# Patient Record
Sex: Male | Born: 1950 | Race: White | Hispanic: No | Marital: Married | Smoking: Never smoker
Health system: Southern US, Community
[De-identification: ages and names within clinical notes are randomized; demographics above are authoritative.]

## PROBLEM LIST (undated history)

## (undated) DIAGNOSIS — E119 Type 2 diabetes mellitus without complications: Secondary | ICD-10-CM

## (undated) DIAGNOSIS — G4733 Obstructive sleep apnea (adult) (pediatric): Secondary | ICD-10-CM

## (undated) DIAGNOSIS — R7303 Prediabetes: Secondary | ICD-10-CM

## (undated) DIAGNOSIS — Z86711 Personal history of pulmonary embolism: Secondary | ICD-10-CM

## (undated) DIAGNOSIS — E785 Hyperlipidemia, unspecified: Secondary | ICD-10-CM

## (undated) DIAGNOSIS — I440 Atrioventricular block, first degree: Secondary | ICD-10-CM

## (undated) DIAGNOSIS — Z87442 Personal history of urinary calculi: Secondary | ICD-10-CM

## (undated) DIAGNOSIS — I82409 Acute embolism and thrombosis of unspecified deep veins of unspecified lower extremity: Secondary | ICD-10-CM

## (undated) DIAGNOSIS — Z8719 Personal history of other diseases of the digestive system: Secondary | ICD-10-CM

## (undated) DIAGNOSIS — Z85038 Personal history of other malignant neoplasm of large intestine: Secondary | ICD-10-CM

## (undated) DIAGNOSIS — E669 Obesity, unspecified: Secondary | ICD-10-CM

## (undated) DIAGNOSIS — K449 Diaphragmatic hernia without obstruction or gangrene: Secondary | ICD-10-CM

## (undated) DIAGNOSIS — Z7901 Long term (current) use of anticoagulants: Secondary | ICD-10-CM

## (undated) DIAGNOSIS — G4730 Sleep apnea, unspecified: Secondary | ICD-10-CM

## (undated) DIAGNOSIS — C189 Malignant neoplasm of colon, unspecified: Secondary | ICD-10-CM

## (undated) DIAGNOSIS — N201 Calculus of ureter: Secondary | ICD-10-CM

## (undated) DIAGNOSIS — Z86718 Personal history of other venous thrombosis and embolism: Secondary | ICD-10-CM

## (undated) DIAGNOSIS — I2699 Other pulmonary embolism without acute cor pulmonale: Secondary | ICD-10-CM

## (undated) HISTORY — PX: LAPAROSCOPIC ASSISTED VENTRAL HERNIA REPAIR: SHX6312

## (undated) HISTORY — PX: COLON SURGERY: SHX602

## (undated) HISTORY — PX: LITHOTRIPSY: SUR834

## (undated) HISTORY — PX: HERNIA REPAIR: SHX51

## (undated) HISTORY — PX: PARTIAL COLECTOMY: SHX5273

## (undated) HISTORY — PX: TOTAL KNEE ARTHROPLASTY: SHX125

## (undated) HISTORY — PX: REPLACEMENT TOTAL KNEE: SUR1224

---

## 2001-07-08 ENCOUNTER — Ambulatory Visit: Admission: RE | Admit: 2001-07-08 | Discharge: 2001-07-08 | Payer: Self-pay | Admitting: Internal Medicine

## 2001-12-25 ENCOUNTER — Ambulatory Visit (HOSPITAL_COMMUNITY): Admission: RE | Admit: 2001-12-25 | Discharge: 2001-12-25 | Payer: Self-pay | Admitting: Internal Medicine

## 2001-12-25 ENCOUNTER — Encounter: Payer: Self-pay | Admitting: Internal Medicine

## 2001-12-25 ENCOUNTER — Encounter: Admission: RE | Admit: 2001-12-25 | Discharge: 2001-12-25 | Payer: Self-pay | Admitting: Internal Medicine

## 2002-01-20 ENCOUNTER — Ambulatory Visit (HOSPITAL_COMMUNITY): Admission: RE | Admit: 2002-01-20 | Discharge: 2002-01-20 | Payer: Self-pay | Admitting: Gastroenterology

## 2002-01-20 ENCOUNTER — Encounter (INDEPENDENT_AMBULATORY_CARE_PROVIDER_SITE_OTHER): Payer: Self-pay | Admitting: Specialist

## 2002-02-10 ENCOUNTER — Encounter (INDEPENDENT_AMBULATORY_CARE_PROVIDER_SITE_OTHER): Payer: Self-pay | Admitting: Specialist

## 2002-02-10 ENCOUNTER — Inpatient Hospital Stay (HOSPITAL_COMMUNITY): Admission: RE | Admit: 2002-02-10 | Discharge: 2002-02-17 | Payer: Self-pay | Admitting: General Surgery

## 2003-02-22 ENCOUNTER — Ambulatory Visit (HOSPITAL_COMMUNITY): Admission: RE | Admit: 2003-02-22 | Discharge: 2003-02-22 | Payer: Self-pay | Admitting: Gastroenterology

## 2003-02-22 ENCOUNTER — Encounter (INDEPENDENT_AMBULATORY_CARE_PROVIDER_SITE_OTHER): Payer: Self-pay | Admitting: *Deleted

## 2003-06-08 ENCOUNTER — Emergency Department (HOSPITAL_COMMUNITY): Admission: AC | Admit: 2003-06-08 | Discharge: 2003-06-08 | Payer: Self-pay

## 2003-12-22 ENCOUNTER — Ambulatory Visit (HOSPITAL_BASED_OUTPATIENT_CLINIC_OR_DEPARTMENT_OTHER): Admission: RE | Admit: 2003-12-22 | Discharge: 2003-12-22 | Payer: Self-pay | Admitting: Internal Medicine

## 2004-12-20 ENCOUNTER — Encounter: Admission: RE | Admit: 2004-12-20 | Discharge: 2004-12-20 | Payer: Self-pay | Admitting: General Surgery

## 2005-02-21 ENCOUNTER — Observation Stay (HOSPITAL_COMMUNITY): Admission: RE | Admit: 2005-02-21 | Discharge: 2005-02-23 | Payer: Self-pay | Admitting: General Surgery

## 2009-08-09 ENCOUNTER — Inpatient Hospital Stay (HOSPITAL_COMMUNITY): Admission: RE | Admit: 2009-08-09 | Discharge: 2009-08-12 | Payer: Self-pay | Admitting: Orthopedic Surgery

## 2009-09-02 HISTORY — PX: TENDON REPAIR: SHX5111

## 2010-12-04 LAB — BASIC METABOLIC PANEL
BUN: 9 mg/dL (ref 6–23)
CO2: 29 mEq/L (ref 19–32)
Calcium: 8.6 mg/dL (ref 8.4–10.5)
Creatinine, Ser: 0.96 mg/dL (ref 0.4–1.5)
GFR calc Af Amer: >60 mL/min (ref >60)
Glucose, Bld: 165 mg/dL — ABNORMAL HIGH (ref 70–99)

## 2010-12-04 LAB — URINALYSIS, ROUTINE W REFLEX MICROSCOPIC
Hgb urine dipstick: NEGATIVE
Nitrite: NEGATIVE
Specific Gravity, Urine: 1.019 (ref 1.005–1.030)
Urobilinogen, UA: 0.2 mg/dL (ref 0.0–1.0)

## 2010-12-04 LAB — PROTIME-INR
INR: 0.95 (ref 0.00–1.49)
INR: 1.16 (ref 0.00–1.49)
INR: 1.72 — ABNORMAL HIGH (ref 0.00–1.49)
Prothrombin Time: 14.7 seconds (ref 11.6–15.2)
Prothrombin Time: 19.3 seconds — ABNORMAL HIGH (ref 11.6–15.2)

## 2010-12-04 LAB — CBC
HCT: 45.1 % (ref 39.0–52.0)
Hemoglobin: 15.4 g/dL (ref 13.0–17.0)
MCHC: 34.2 g/dL (ref 30.0–36.0)
MCHC: 34.6 g/dL (ref 30.0–36.0)
MCHC: 34.8 g/dL (ref 30.0–36.0)
MCV: 92.8 fL (ref 78.0–100.0)
MCV: 92.9 fL (ref 78.0–100.0)
MCV: 93 fL (ref 78.0–100.0)
Platelets: 156 10*3/uL (ref 150–400)
Platelets: 180 10*3/uL (ref 150–400)
Platelets: 230 10*3/uL (ref 150–400)
RBC: 4.86 MIL/uL (ref 4.22–5.81)
RDW: 13.2 % (ref 11.5–15.5)
RDW: 13.3 % (ref 11.5–15.5)
RDW: 13.4 % (ref 11.5–15.5)
RDW: 13.4 % (ref 11.5–15.5)
WBC: 10.2 10*3/uL (ref 4.0–10.5)
WBC: 7.7 10*3/uL (ref 4.0–10.5)

## 2010-12-04 LAB — DIFFERENTIAL
Eosinophils Absolute: 0.1 10*3/uL (ref 0.0–0.7)
Lymphs Abs: 1.8 10*3/uL (ref 0.7–4.0)
Monocytes Relative: 9 % (ref 3–12)
Neutrophils Relative %: 65 % (ref 43–77)

## 2010-12-04 LAB — COMPREHENSIVE METABOLIC PANEL
ALT: 25 U/L (ref 0–53)
AST: 20 U/L (ref 0–37)
Calcium: 10.5 mg/dL (ref 8.4–10.5)
GFR calc Af Amer: >60 mL/min (ref >60)
Glucose, Bld: 94 mg/dL (ref 70–99)
Sodium: 141 mEq/L (ref 135–145)
Total Protein: 7.3 g/dL (ref 6.0–8.3)

## 2010-12-04 LAB — TYPE AND SCREEN
ABO/RH(D): O POS
Antibody Screen: NEGATIVE

## 2011-01-04 ENCOUNTER — Other Ambulatory Visit: Payer: Self-pay | Admitting: Dermatology

## 2011-01-18 NOTE — Discharge Summary (Signed)
[facility]  Patient:    Connor King, Connor King Visit Number: [id number] MRN: [id number]          Service Type: SUR Location: 3W 0379 01 Attending Physician:  Brandy Hale Dictated by:   Angelia Mould. Derrell Lolling, M.D. Admit Date:  02/10/2002 Discharge Date: 02/17/2002   CC:         Thora Lance, M.D.  Genene Churn. Sherin Quarry, M.D.   Discharge Summary  FINAL DIAGNOSES: 1. Carcinoma of the sigmoid colon, stage T1 N0. 2. Acute Meckels diverticulitis. 3. Morbid obesity. 4. History of deep venous thrombosis, right leg.  OPERATION PERFORMED: 1. Sigmoid colectomy. 2. Small bowel resection with Meckels diverticulum. 3. Incidental appendectomy.  DATE OF SURGERY:  02/10/02.  HISTORY OF PRESENT ILLNESS:  The patient is a 60 year old man who developed deep venous thrombosis of his right leg in November 2002, and was placed on Coumadin.  Shortly after starting the Coumadin, he noted a little bit of bright red blood in his stool.  In early May 2003, he was treated for an "acute diverticulitis" with fever and right-sided abdominal pain.  He was given antibiotics.  A CT scan done at [facility] was normal.  Treatment was continued, and he improved.  Colonoscopy performed on Jan 20, 2002, showed several polyps in the sigmoid colon.  Most of these were small and were benign histologically, but there was a larger 3 cm polyp in the mid sigmoid which was ______ in several sections, but eventually removed in its entirety.  This revealed invasive adenocarcinoma arising in a large adenomatous polyp.  Two were invading into the stalk, and the tumor was present at the base of the polyp.  The patient was evaluated by me as an outpatient.  He was advised to undergo segmental resection of his sigmoid colon.  He had agreed with this. He underwent a bowel prep at home, and was brought to the hospital electively.  PHYSICAL EXAMINATION:  GENERAL:  A very pleasant gentleman,  morbidly obese with a stated weight of 350 pounds.  NECK:  No mass.  LUNGS:  Clear.  HEART:  Regular rate and rhythm, no murmur.  ABDOMEN:  Morbidly obese, soft, nontender, no mass, no herniated noted.  EXTREMITIES:  No edema, good pulses.  HOSPITAL COURSE:  On the day of admission, the patient was taken to the operating room.  He underwent exploratory laparotomy.  I felt no mass in the sigmoid colon, and a sigmoid colon resection was performed.  On exploration of the abdomen, I found acutely inflamed Meckels diverticulum in the right upper quadrant, but no abscess.  It was felt that this may have accounted for his pain and fever one month previously.  Segmental small bowel resection was performed.  Incidental appendectomy was performed.  The surgery was uneventful.  Final pathology report revealed that there was no residual cancer in the sigmoid colon, but the polypectomy site was identified.  Six benign lymph nodes were noted.  The appendix was benign.  Small bowel resection revealed Meckels diverticulum with inflammation and fibrosis, no malignancy seen.  Postoperatively, the patient did well.  He had low urine output the first 24 hours, but with fluid resuscitation his urine output picked up and he did well thereafter.  He had an ileus for several days.  Ultimately, the nasogastric tube was removed on 02/14/02.  He was maintained on heparin because of his chronic deep venous thrombosis, and we restarted his Coumadin.  However, shortly thereafter, after reviewing his  outpatient records, he had already had six months of Coumadin and this was discontinued.  He was discharged on 02/17/02.  At that time, he was doing well, tolerating a regular diet, had bowel movements, felt well and wanted to go home, he had no wound problems.  DISCHARGE MEDICATIONS: 1. Vicodin for pain. 2. Multivitamins with iron.  DISCHARGE LABORATORY DATA:  Hemoglobin was 9.2 on discharge.  FOLLOWUP:  He  was asked to follow up with me in the office in five to six days for staple removal. Dictated by:   Angelia Mould. Derrell Lolling, M.D. Attending Physician:  Brandy Hale DD:  03/18/02 TD:  03/22/02 Job: [id number]

## 2011-01-18 NOTE — Op Note (Signed)
[facility]  Patient:    Connor King, Connor King Visit Number: [id number] MRN: [id number]          Service Type: SUR Location: 3W 0379 01 Attending Physician:  Brandy Hale Dictated by:   Angelia Mould. Derrell Lolling, M.D. Proc. Date: 02/10/02 Admit Date:  02/10/2002 Discharge Date: 02/17/2002   CC:         Genene Churn. Sherin Quarry, M.D.  Thora Lance, M.D.   Operative Report  PREOPERATIVE DIAGNOSIS:  Carcinoma of the sigmoid colon.  POSTOPERATIVE DIAGNOSES: 1. Carcinoma of the sigmoid colon. 2. Meckels diverticulitis.  OPERATION: 1. Sigmoid colectomy with primary anastomosis. 2. Small bowel resection with inflamed Meckels diverticulum. 3. Incidental appendectomy.  SURGEON:  Angelia Mould. Derrell Lolling, M.D.  FIRST ASSISTANT:  Anselm Pancoast. Zachery Dakins, M.D.  OPERATIVE INDICATIONS:  This is a 60 year old, white man with a history of deep venous thrombosis of the right leg in November 2002.  While on Coumadin he started seeing some blood in his stool, although this was low volume. He felt a little bit fatigued.  He was treated for "diverticulitis" about six weeks ago.  He was placed on antibiotics because of fever and right lower quadrant pain.  He had a CT scan at [facility] which was reportedly normal. With the antibiotics he felt better.  He continued to have low volume rectal bleeding.  Colonoscopy was performed on Jan 20, 2002.  There were four polyps in the sigmoid colon.  Three of these were benign but there was a 3.0 cm polyp in the mid sigmoid colon which was removed "in its entirety" and on pathologic exam, it showed invasive adenocarcinoma arrising adenomatous polyp with the tumor into the submucosa and the tumor present at the base of the polyp. It was felt that he was at high risk for recurrent cancer and colon resection was recommended.  He was brought to the operating room electively after a bowel prep.  OPERATIVE TECHNIQUE:  Following the  induction of general endotracheal anesthesia, the patient was placed in the dorsal lithotomy position. I performED a colonoscopy and rigid proctoscopy.  I was able to pass both scopes up into the sigmoid colon and passed the colonoscope beyond that and I did not see any mucosal lesion.  I did not see any ulceration and therefore could not precisely localize the area where the adenomatous polyp had been.  The patient was then placed supine.  The abdomen was prepped and draped in a sterile fashion.  Midline laparotomy was performed and the abdomen was entered and explored.  I examined the appendix and that was normal.  I examined the right colon and it was normal.  I examined the sigmoid colon and descending colon and I felt no mass, saw no inflammatory process, really no evidence of diverticulitis. In the right upper quadrant, there was inflammatory mass involving the small bowel which had to be taken down and I found that he had what appeared to be an acutely and chronically inflamed Meckels diverticulum about 2-1/2 to 3 feet proximal to the ileocecal valve.  The liver and gallbladder felt normal. The omentum looked normal.  There was ascites.  I mobilized the sigmoid colon and descending colon by dividing their lateral peritoneal attachments. The mesentry was scored.  I transected the proximal rectum using a GIA stapling device.  This was transected about 8 or 10 cm above the peritoneal reflection.  The mesenteric vessels were then generously isolated, clamped, divided and ligated with 2-0 silk ties.  We transected the colon proximally at the junction of the descending colon and the sigmoid colon and in doing so removed about 12 inches of sigmoid colon.  The specimen was sent to the pathology department with the history of the cancerous polypectomy having been done.  We set up the anastomosis end-to-end with the distal descending colon anastomosis to the proximal rectum.  We cut off  the staple line in the proximal rectum.  Corner sutures of 2-0 silk were placed to set up the anastomosis.  The posterior wall of the anastomosis was created using interrupted sutures of 2-0 silk and all of these sutures were then tied.  The corners were turned in very carefully with inverting sutures.  The anterior wall of the anastomosis was completed with interrupted Lembert sutures of 2-0 silk and this provided a very secure anastomosis circumferentially.  There was no tension on the anastomosis.  There is very little mesentry to close it and so we chose not to do that.  We irrigated the pelvis out and found no active bleeding.  All of the packs were removed.  We then turned our attention to the appendix and decided to remove the appendix because of the history of right lower quadrant pain.  The appendix did look normal, however.  Appendiceal mesentry was isolated, clamped, divided and ligated with 2-0 silk ties.  A pursestring suture of 3-0 silk was placed in the cecum around the base of the appendix.  The appendix was clamped and amputated and removed.  The appendiceal stump was tied off with a 3-0 chromic tie.  The appendiceal stump was inverted and the pursestring suture tied. This provided a very secure closure of the cecal serosa.  We then turned our attention to the Meckels diverticulum.  We had to extend the midline incision up somewhat and very carefully had to dissect the inflamed Meckels away from the omentum.  Ultimately we were able to completely dissect this down out of the right upper quadrant.  We inspected the right colon, hepatic flexure and transverse colon and found that there was no injury or involvement of the colon.  We inspected the rest of the small bowel and found no other abnormality.  A couple of defects in the mesentry were actively bleeding and the bleeders were controlled.  Because of the  mesenteric defects and the acute inflammation we chose to resect  this area of small bowel.  We resected the small bowel about 1 inch proximal to the Meckels and about 12 inches distal to the Meckels to involve all of the areas of the damaged mesentry.  We isolated the mesenteric vessels, clamped, divided and ligated them with 2-0 silk ties.  We then placed the bowel walls side-to-side and created anastomosis with GIA stapling device on the antimesenteric wall.  We then held up the open bowel and closed it and transected it with a TA-60 stapling device.  This completed the anastomosis and completed the resection of the small bowel specimen with the Meckels which was sent to pathology.  The mesenteric bleeders were controlled with 2-0 silk sutures.  The mesentery in the small bowel was closed with interrupted sutures of 2-0 silk.  We then spent a great deal of time irrigating the abdomen and checking for bleeding.  We must have irrigated with 5 to 6 liters of saline.  There was no bleeding in any areas of dissection at the completion of the case.  Small bowel was carefully returned to its anatomic  position.  The colon anastomosis was again checked and looked fine. Omentum was returned to its anatomic position. Midline fascia was closed with a running suture of #1 PDS and the skin closed with skin staples.  Clean bandages were placed and the patient taken to the recovery room in stable condition.  Estimated blood loss was about 500 cc.  Complications were none.  Sponge, needle and instrument counts were correct. Dictated by:   Angelia Mould. Derrell Lolling, M.D. Attending Physician:  Brandy Hale DD:  02/10/02 TD:  02/12/02 Job: [id number]

## 2011-01-18 NOTE — Op Note (Signed)
NAMEVEER, ELAMIN               ACCOUNT NO.:  [id number]   MEDICAL RECORD NO.:  [id number]          PATIENT TYPE:  AMB   LOCATION:  DAY                          FACILITY:  [facility]   PHYSICIAN:  Angelia Mould. Derrell Lolling, M.D.DATE OF BIRTH:  [birth year]   DATE OF PROCEDURE:  02/21/2005  DATE OF DISCHARGE:                                 OPERATIVE REPORT   PREOPERATIVE DIAGNOSIS:  Incarcerated ventral hernia.   POSTOPERATIVE DIAGNOSES:  Incarcerated ventral hernia.   OPERATION PERFORMED:  1.  Laparoscopic lysis of adhesions (extensive).  2.  Laparoscopic ventral hernia repair with 18 cm x 22 cm Parietex composite      mesh.   SURGEON:  Angelia Mould. Derrell Lolling, M.D.   FIRST ASSISTANT:  Anselm Pancoast. Zachery Dakins, M.D.   OPERATIVE INDICATIONS:  This is a 60 year old white man with morbid obesity  who underwent a sigmoid colon resection with primary anastomosis, small  bowel resection for Meckel's diverticulitis and incidental appendectomy on  February 10, 2002. He had a small carcinoma of the sigmoid colon stage T1N0.  There has been no evidence of recurrence. He has noticed a bulge just to the  right of his umbilicus for several months. He has been gaining weight and is  up to 345 pounds. On exam, he has a moderately large ventral hernia  protruding to the right at the umbilicus. The defect in the fascia is  probably at least 6 cm in size, I can partially reduce this. CT scan shows a  lot of omentum in this and one loop of small bowel close by. Elective  surgery was advised because of the risk of incarceration and strangulation.  He is brought to the operating room electively.   OPERATIVE TECHNIQUE:  Following the induction of general endotracheal  anesthesia, intravenous antibiotics were given. The patient was identified,  the abdomen was prepped and draped in a sterile fashion. A 12 mm Optiview  port was placed in the left upper quadrant, this was atraumatic, inserted  without difficulty and  pneumoperitoneum was created. A video camera was  inserted. We found that he had extensive adhesions from the falciform  ligament all the way down into the pelvis although things were free on the  left inside where the trocar was. Most of these adhesions were omental, but  there were some small bowel adhesions that were fairly complicated. I placed  a 5 mm trocar in the left lower quadrant and a 5 mm trocar in the upper  abdomen to the left of the falciform ligament. Using a variety of dissection  techniques, blunt dissection, sharp scissor dissection, harmonic scalpel, we  slowly took down adhesions. This was a very tedious job because there was  lots of adhesions and there was a  primary defect about 6 or 7 cm in size  just to the right and above the umbilicus and a couple of smaller defects,  just below that. We had two or three loops of small bowel that were very  close to the hernias and I had to the very slowly dissect these with scissor  dissection but was  able to do this. I was able to reduce the fatty tissue  and omentum from the hernia sac and dissect all of that out of the hernia  sac and leave the hernia sac back in the defect. After about 90 minutes, we  had all of the dissection done and the abdominal wall completely cleaned off  circumferentially.   We marked the hernia defects with a spinal needle and then marked a circle  around the defect 4 cm out to the side. We measured this and it was about 16  x 20 cm. We brought a large piece of Parietex dual layer composite mesh to  the operative field and drew a template on the mesh to match the template on  the abdominal wall. We marked eight equidistant points for eight fixation  sutures. This wound up being about 22 cm vertically by about 18 cm  transversely. We placed eight interrupted sutures of #0 Nurolon in the mesh  all the way around being careful to place the sutures and the knots on the  rough surface that would go up  against the abdominal wall and to be sure  that the smooth atraumatic surface would be against the abdominal viscera.  After we had placed all eight sutures, we moistened the mesh, rolled it up  and inserted it into the abdominal cavity. We positioned the mesh and  oriented it according to the markings. We then made small incisions at the  suture fixation points. We then passed all of the sutures through the  abdominal wall at the eight suture fixation points being careful to get at  least a 1 cm bite of the IT fascia. After all of the sutures were past the  abdominal wall, the mesh was then lifted up and the positioning was actually  quite good with very little redundancy in the mesh. We tied all eight  sutures and then used a 5 mm tacker to tack the perimeter of the mesh to the  abdominal wall. Great care was taken to be able to palpate the tacker  through the abdominal wall so as to sink the tacks into the abdominal wall  fascia and muscle. We we used almost 60 tacks to do this so that they would  be fairly close to each other around the perimeter to avoid any gaps. We  placed a few tacks centrally to prevent the mesh from billowing. After all  of this was done, we checked all of the areas of dissection. We then checked  the small bowel and omentum. There had been a little bit of bleeding but  this was all stopped and there is no active bleeding whatsoever. The small  bowel all looked perfectly healthy, there was no sign of any injury  whatsoever not even a serosal tear that we could identify. The  pneumoperitoneum was released and the trocars were removed. The incisions  were closed with skin staples. Clean bandages were placed and the patient  taken to the recovery room in stable condition. Estimated blood loss was  about 30-40 mL. Complications none. Sponge, needle and instrument counts  were correct.      HMI/MEDQ  D:  02/21/2005  T:  02/21/2005  Job:  [id number]   cc:   Thora Lance, M.D.  [address] [place]  [place] [address]  Fax: [phone]   Tasia Catchings, M.D.  [address]  [address]  [place]  [place] [address]  Fax: [phone]

## 2011-04-16 ENCOUNTER — Other Ambulatory Visit: Payer: Self-pay | Admitting: Gastroenterology

## 2011-04-16 ENCOUNTER — Ambulatory Visit (HOSPITAL_COMMUNITY)
Admission: RE | Admit: 2011-04-16 | Discharge: 2011-04-16 | Disposition: A | Discharge status: Home / Self Care | Payer: Managed Care, Other (non HMO) | Source: Ambulatory Visit | Attending: Gastroenterology | Admitting: Gastroenterology

## 2011-04-16 DIAGNOSIS — K5730 Diverticulosis of large intestine without perforation or abscess without bleeding: Secondary | ICD-10-CM | POA: Insufficient documentation

## 2011-04-16 DIAGNOSIS — Z85038 Personal history of other malignant neoplasm of large intestine: Secondary | ICD-10-CM | POA: Insufficient documentation

## 2011-04-16 DIAGNOSIS — K620 Anal polyp: Secondary | ICD-10-CM | POA: Insufficient documentation

## 2011-04-16 DIAGNOSIS — K621 Rectal polyp: Secondary | ICD-10-CM | POA: Insufficient documentation

## 2011-04-16 DIAGNOSIS — G4733 Obstructive sleep apnea (adult) (pediatric): Secondary | ICD-10-CM | POA: Insufficient documentation

## 2011-04-16 DIAGNOSIS — D126 Benign neoplasm of colon, unspecified: Secondary | ICD-10-CM | POA: Insufficient documentation

## 2012-07-24 ENCOUNTER — Inpatient Hospital Stay (HOSPITAL_COMMUNITY)
Admission: EM | Admit: 2012-07-24 | Discharge: 2012-07-27 | DRG: 494 | Disposition: A | Discharge status: Home / Self Care | Payer: BC Managed Care – PPO | Attending: General Surgery | Admitting: General Surgery

## 2012-07-24 ENCOUNTER — Encounter (HOSPITAL_COMMUNITY): Payer: Self-pay | Admitting: Emergency Medicine

## 2012-07-24 ENCOUNTER — Emergency Department (HOSPITAL_COMMUNITY): Payer: BC Managed Care – PPO

## 2012-07-24 ENCOUNTER — Ambulatory Visit
Admission: RE | Admit: 2012-07-24 | Discharge: 2012-07-24 | Disposition: A | Discharge status: Home / Self Care | Payer: BC Managed Care – PPO | Source: Ambulatory Visit | Attending: Internal Medicine | Admitting: Internal Medicine

## 2012-07-24 ENCOUNTER — Other Ambulatory Visit: Payer: Self-pay | Admitting: Internal Medicine

## 2012-07-24 DIAGNOSIS — Z96659 Presence of unspecified artificial knee joint: Secondary | ICD-10-CM

## 2012-07-24 DIAGNOSIS — K819 Cholecystitis, unspecified: Secondary | ICD-10-CM

## 2012-07-24 DIAGNOSIS — K8010 Calculus of gallbladder with chronic cholecystitis without obstruction: Secondary | ICD-10-CM

## 2012-07-24 DIAGNOSIS — K8000 Calculus of gallbladder with acute cholecystitis without obstruction: Principal | ICD-10-CM | POA: Diagnosis present

## 2012-07-24 DIAGNOSIS — R1011 Right upper quadrant pain: Secondary | ICD-10-CM

## 2012-07-24 DIAGNOSIS — Z85038 Personal history of other malignant neoplasm of large intestine: Secondary | ICD-10-CM

## 2012-07-24 DIAGNOSIS — Z86718 Personal history of other venous thrombosis and embolism: Secondary | ICD-10-CM

## 2012-07-24 DIAGNOSIS — Z6841 Body Mass Index (BMI) 40.0 and over, adult: Secondary | ICD-10-CM

## 2012-07-24 DIAGNOSIS — G4730 Sleep apnea, unspecified: Secondary | ICD-10-CM | POA: Diagnosis present

## 2012-07-24 HISTORY — DX: Acute embolism and thrombosis of unspecified deep veins of unspecified lower extremity: I82.409

## 2012-07-24 HISTORY — DX: Sleep apnea, unspecified: G47.30

## 2012-07-24 HISTORY — DX: Malignant neoplasm of colon, unspecified: C18.9

## 2012-07-24 HISTORY — DX: Diaphragmatic hernia without obstruction or gangrene: K44.9

## 2012-07-24 HISTORY — DX: Obesity, unspecified: E66.9

## 2012-07-24 LAB — COMPREHENSIVE METABOLIC PANEL
ALT: 16 U/L (ref 0–53)
Calcium: 10.3 mg/dL (ref 8.4–10.5)
Creatinine, Ser: 1.01 mg/dL (ref 0.50–1.35)
GFR calc Af Amer: >90 mL/min (ref >90)
GFR calc non Af Amer: 78 mL/min — ABNORMAL LOW (ref >90)
Glucose, Bld: 128 mg/dL — ABNORMAL HIGH (ref 70–99)
Sodium: 139 mEq/L (ref 135–145)
Total Protein: 7.7 g/dL (ref 6.0–8.3)

## 2012-07-24 LAB — CBC WITH DIFFERENTIAL/PLATELET
Basophils Relative: 0 % (ref 0–1)
Hemoglobin: 15.8 g/dL (ref 13.0–17.0)
Lymphs Abs: 1.3 10*3/uL (ref 0.7–4.0)
MCHC: 34.7 g/dL (ref 30.0–36.0)
Monocytes Relative: 7 % (ref 3–12)
Neutro Abs: 10.4 10*3/uL — ABNORMAL HIGH (ref 1.7–7.7)
Neutrophils Relative %: 82 % — ABNORMAL HIGH (ref 43–77)
RBC: 5.04 MIL/uL (ref 4.22–5.81)

## 2012-07-24 LAB — TROPONIN I: Troponin I: <0.3 ng/mL (ref <0.30)

## 2012-07-24 MED ORDER — SODIUM CHLORIDE 0.9 % IV SOLN
3.0000 g | Freq: Once | INTRAVENOUS | Status: AC
  Administered 2012-07-24: 3 g via INTRAVENOUS
  Filled 2012-07-24: qty 3

## 2012-07-24 MED ORDER — IOHEXOL 350 MG/ML SOLN
100.0000 mL | Freq: Once | INTRAVENOUS | Status: AC | PRN
  Administered 2012-07-24: 100 mL via INTRAVENOUS

## 2012-07-24 NOTE — H&P (Signed)
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   Chief Complaint: right upper quadrant abdominal pain HPI: patient is a 61 year old male who just about 24 hours ago developed the rapid onset of moderately severe sharp and pressure-like right upper quadrant pain and pain beneath his right rib cage on the right chest. This was associated with some nausea but no vomiting. The pain persisted through the night last night and he saw his PCP this morning. Blood work was drawn and he was sent for an outpatient ultrasound of the gallbladder. As noted below this shows multiple large gallstones without apparent evidence of cholecystitis. He was found to have a minimally elevated d-dimer and with a history of previous DVT he was [facility] for a CT angiogram the chest to rule out PE. As noted below this does not show a PE but does show evidence of cholecystitis with pericholecystic fluid. He has continued to have ongoing pain beneath his right rib cage and right upper quadrant abdomen although this is not markedly severe. He is not currently having any nausea or vomiting. No fever or chills. He has no history of any similar problems. No problems with his bowel movements. No urinary symptoms. No evidence of jaundice.  Past Medical History  Diagnosis Date  . DVT (deep venous thrombosis)   . Sleep apnea   . Obese   . Hiatal hernia   . Colon cancer     Past Surgical History  Procedure Date  . Replacement total knee     left knee  . Hernia repair   . Colon surgery     History reviewed. No pertinent family history. Social History:  reports that he has never smoked. He does not have any smokeless tobacco history on file. He reports that he does not drink alcohol or use illicit drugs.  Allergies: No Known Allergies  Current Facility-Administered Medications  Medication Dose Route Frequency Provider Last Rate Last Dose  . [COMPLETED] Ampicillin-Sulbactam (UNASYN) 3 g in sodium chloride 0.9 %  100 mL IVPB  3 g Intravenous Once Ward Givens, MD 100 mL/hr at 07/24/12 2117 3 g at 07/24/12 2117  . [COMPLETED] iohexol (OMNIPAQUE) 350 MG/ML injection 100 mL  100 mL Intravenous Once PRN Medication Radiologist, MD   100 mL at 07/24/12 2005   No current outpatient prescriptions on file.     Results for orders placed during the hospital encounter of 07/24/12 (from the past 48 hour(s))  TROPONIN I     Status: Normal   Collection Time   07/24/12  6:26 PM      Component Value Range Comment   Troponin I <0.30  <0.30 ng/mL   COMPREHENSIVE METABOLIC PANEL     Status: Abnormal   Collection Time   07/24/12  6:40 PM      Component Value Range Comment   Sodium 139  135 - 145 mEq/L    Potassium 3.6  3.5 - 5.1 mEq/L    Chloride 99  96 - 112 mEq/L    CO2 26  19 - 32 mEq/L    Glucose, Bld 128 (*) 70 - 99 mg/dL    BUN 17  6 - 23 mg/dL    Creatinine, Ser 1.61  0.50 - 1.35 mg/dL    Calcium 09.6  8.4 - 10.5 mg/dL    Total Protein 7.7  6.0 - 8.3 g/dL    Albumin 4.1  3.5 - 5.2 g/dL    AST 19  0 - 37 U/L    ALT 16  0 - 53 U/L    Alkaline Phosphatase 90  39 - 117 U/L    Total Bilirubin 0.7  0.3 - 1.2 mg/dL    GFR calc non Af Amer 78 (*) >90 mL/min    GFR calc Af Amer >90  >90 mL/min   D-DIMER, QUANTITATIVE     Status: Abnormal   Collection Time   07/24/12  6:40 PM      Component Value Range Comment   D-Dimer, Quant 0.52 (*) 0.00 - 0.48 ug/mL-FEU   CBC WITH DIFFERENTIAL     Status: Abnormal   Collection Time   07/24/12  6:40 PM      Component Value Range Comment   WBC 12.6 (*) 4.0 - 10.5 K/uL    RBC 5.04  4.22 - 5.81 MIL/uL    Hemoglobin 15.8  13.0 - 17.0 g/dL    HCT 16.1  09.6 - 04.5 %    MCV 90.3  78.0 - 100.0 fL    MCH 31.3  26.0 - 34.0 pg    MCHC 34.7  30.0 - 36.0 g/dL    RDW 40.9  81.1 - 91.4 %    Platelets 225  150 - 400 K/uL    Neutrophils Relative 82 (*) 43 - 77 %    Neutro Abs 10.4 (*) 1.7 - 7.7 K/uL    Lymphocytes Relative 10 (*) 12 - 46 %    Lymphs Abs 1.3  0.7 - 4.0 K/uL      Monocytes Relative 7  3 - 12 %    Monocytes Absolute 0.9  0.1 - 1.0 K/uL    Eosinophils Relative 0  0 - 5 %    Eosinophils Absolute 0.0  0.0 - 0.7 K/uL    Basophils Relative 0  0 - 1 %    Basophils Absolute 0.0  0.0 - 0.1 K/uL    Ct Angio Chest W/cm &/or Wo Cm  07/24/2012  *RADIOLOGY REPORT*  Clinical Data: Right-sided pain.  History of deep venous thrombosis.  Question pulmonary embolism.  CT ANGIOGRAPHY CHEST  Technique:  Multidetector CT imaging of the chest using the standard protocol during bolus administration of intravenous contrast. Multiplanar reconstructed images including MIPs were obtained and reviewed to evaluate the vascular anatomy.  Contrast: OMNIPAQUE IOHEXOL 350 MG/ML SOLN  Comparison: Plain film of 07/24/2012.  No prior CT.  Findings: Lung windows demonstrate mild motion degradation throughout.  Mild degradation secondary patient body habitus.  Clear lungs.  Soft tissue windows:  The quality of this exam for evaluation of pulmonary embolism is poor to moderate. In addition to the above factors, bolus is suboptimally timed, centered in the SVC.  No central pulmonary embolism.  No lobar embolism.  No large segmental embolism, especially to the lower lobes.  Smaller emboli cannot be excluded.  Pulmonary artery enlargement, the outflow tract measuring 3.6 cm. No evidence of aortic aneurysm.  No gross dissection.  Bolus timing limits evaluation for dissection.  Moderate cardiomegaly, without pericardial or pleural effusion. No mediastinal or hilar adenopathy.  Limited abdominal imaging demonstrates mild hepatic steatosis. Gallstones. Suspect mild pericholecystic edema on the final image of the study.  Bilateral mild gynecomastia.  Mid thoracic hemangioma.  IMPRESSION:  1.  Suboptimal evaluation for pulmonary embolism.  No large embolism. 2.  Cholelithiasis.  Possible pericholecystic edema, incompletely imaged.  Given right-sided symptoms, ultrasound should be considered to exclude  early acute cholecystitis. 3. Pulmonary artery enlargement suggests  pulmonary arterial hypertension. 4.  Mild hepatic steatosis.   Original Report Authenticated By: Jeronimo Greaves, M.D.    US Abdomen Limited Ruq  07/24/2012  *RADIOLOGY REPORT*  Clinical Data:  Right upper quadrant pain, nausea  LIMITED ABDOMINAL ULTRASOUND - RIGHT UPPER QUADRANT  Comparison:  None.  Findings:  Gallbladder:  There are a few gallstones present within the gallbladder with acoustical shadowing.  The largest gallstone measures 2.4 cm.  No pain is present over the gallbladder with compression.  Common bile duct:  The common bile duct is normal measuring 3.6 mm in diameter.  Liver:  The liver is diffusely echogenic consistent with fatty infiltration.  No focal abnormality is seen.  IMPRESSION:  1.  Several gallstones the largest of 2.4 cm.  No pain over the gallbladder currently. 2.  Diffuse fatty infiltration of the liver.                    Original Report Authenticated By: Dwyane Dee, M.D.     Review of Systems  Constitutional: Negative for fever, chills and malaise/fatigue.  Respiratory: Negative for cough, hemoptysis, sputum production, shortness of breath and wheezing.   Cardiovascular: Negative for chest pain, palpitations and leg swelling.  Gastrointestinal: Positive for nausea and abdominal pain. Negative for vomiting, diarrhea, constipation, blood in stool and melena.  Genitourinary: Negative.     Blood pressure 136/81, pulse 64, temperature 98.4 F (36.9 C), resp. rate 20, height 6\' 2"  (1.88 m), weight 348 lb (157.852 kg), SpO2 96.00%. Physical Exam  General: Pleasant morbidly obese Caucasian male who does not appear in any distress Skin: Warm and dry without rash or infection HEENT: No palpable masses or thyromegaly. Sclera nonicteric. Oropharynx clear. Pupils equal and reactive. Lungs: Clear equal breath sounds bilaterally without increased work of breathing  cardiovascular: Regular rate and rhythm. No  murmurs. No edema. Abdomen: Well-healed midline incision. Mostly in the lower abdomen. No evidence of recurrent hernia. There is mild to moderate right upper quadrant tenderness with no guarding. No discernible masses organomegaly with exam limited by obesity. Extremities: Trace ankle edema. Well perfused. Neurologic: Alert and fully oriented. Motor and sensory exam grossly normal.  Assessment/Plan Acute and persistent right upper quadrant abdominal pain consistent with acute cholecystitis. Ultrasound shows multiple gallstones the CT shows fluid around the gallbladder. He has a mildly elevated white count and normal LFTs. I've recommended admission for urgent laparoscopic cholecystectomy and cholangiogram. I discussed the indications for the procedure and alternative treatments as well as risks of anesthetic complications, bleeding, infection, possibly tropic procedure, bile duct or intestinal injury.  Morbid obesity. History of DVT. Will start on subcutaneous heparin. CPAP.  Heran Campau T 07/24/2012, 10:20 PM

## 2012-07-24 NOTE — ED Notes (Signed)
Leadership rounding completed

## 2012-07-24 NOTE — ED Provider Notes (Signed)
History     CSN: [id number]  Arrival date & time 07/24/12  6962   First MD Initiated Contact with Patient 07/24/12 1835      Chief Complaint  Patient presents with  . Chest Pain    right side    (Consider location/radiation/quality/duration/timing/severity/associated sxs/prior treatment) HPI  Patient reports a 2:30 this morning he woke up with right lower chest pain that has been constant all day. He states it's not sharp but it's more of a dull nagging pain. He states nothing he does makes it hurt more, nothing he does makes it feel better. He has had nausea and dry heaves earlier today. He's had a mild decrease of appetite. He denies fever cough or shortness of breath. He was seen today by Dr. Clent Ridges at [facility] today and had blood work with a d-dimer of 0.6. He also had outpatient gallbladder ultrasound which did show gallstones but no inflammatory changes of the gallbladder. He was sent to the ER to get a CT angiography his chest.  Patient states he did have DVT in his leg however that was in 2002  PCP Dr. Kirby Funk  Past Medical History  Diagnosis Date  . DVT (deep venous thrombosis)   . Sleep apnea   . Obese   . Hiatal hernia   . Colon cancer     Past Surgical History  Procedure Date  . Replacement total knee     left knee  . Hernia repair   . Colon surgery     History reviewed. No pertinent family history.  History  Substance Use Topics  . Smoking status: Never Smoker   . Smokeless tobacco: Not on file  . Alcohol Use: No   Lives with spouse Lives at home Semi retired Hydrologist   Review of Systems  All other systems reviewed and are negative.    Allergies  Review of patient's allergies indicates no known allergies.  Home Medications  No current outpatient prescriptions on file.  BP 136/81  Pulse 64  Temp 98.4 F (36.9 C)  Resp 20  Ht 6\' 2"  (1.88 m)  Wt 348 lb (157.852 kg)  BMI 44.68 kg/m2  SpO2 96%  Vital signs  normal    Physical Exam  Nursing note and vitals reviewed. Constitutional: He is oriented to person, place, and time. He appears well-developed and well-nourished.  Non-toxic appearance. He does not appear ill. No distress.       Obese  HENT:  Head: Normocephalic and atraumatic.  Right Ear: External ear normal.  Left Ear: External ear normal.  Nose: Nose normal. No mucosal edema or rhinorrhea.  Mouth/Throat: Oropharynx is clear and moist and mucous membranes are normal. No dental abscesses or uvula swelling.  Eyes: Conjunctivae normal and EOM are normal. Pupils are equal, round, and reactive to light.  Neck: Normal range of motion and full passive range of motion without pain. Neck supple.  Cardiovascular: Normal rate, regular rhythm and normal heart sounds.  Exam reveals no gallop and no friction rub.   No murmur heard. Pulmonary/Chest: Effort normal and breath sounds normal. No respiratory distress. He has no wheezes. He has no rhonchi. He has no rales. He exhibits no tenderness and no crepitus.  Abdominal: Soft. Normal appearance and bowel sounds are normal. He exhibits no distension. There is no tenderness. There is no rebound and no guarding.  Musculoskeletal: Normal range of motion. He exhibits no edema and no tenderness.       Moves all  extremities well.   Neurological: He is alert and oriented to person, place, and time. He has normal strength. No cranial nerve deficit.  Skin: Skin is warm, dry and intact. No rash noted. No erythema. No pallor.  Psychiatric: He has a normal mood and affect. His speech is normal and behavior is normal. His mood appears not anxious.    ED Course  Procedures (including critical care time)   Medications  Ampicillin-Sulbactam (UNASYN) 3 g in sodium chloride 0.9 % 100 mL IVPB (3 g Intravenous New Bag/Given 07/24/12 2117)  iohexol (OMNIPAQUE) 350 MG/ML injection 100 mL (100 mL Intravenous Contrast Given 07/24/12 2005)   Patient refused pain  medicine several times.  I discussed this CT scan with Dr. Reche Dixon who feels CT scan  is more accurate in detecting inflammatory changes of the gallbladder in this situation because of the patient's body habitus.  2100 per to our nurse she will let Dr. Johna Sheriff know patient needs to be seen in the ED.  22:00 Dr Johna Sheriff is here in ED and is planning on admitting patient.    Results for orders placed during the hospital encounter of 07/24/12  TROPONIN I      Component Value Range   Troponin I <0.30  <0.30 ng/mL  COMPREHENSIVE METABOLIC PANEL      Component Value Range   Sodium 139  135 - 145 mEq/L   Potassium 3.6  3.5 - 5.1 mEq/L   Chloride 99  96 - 112 mEq/L   CO2 26  19 - 32 mEq/L   Glucose, Bld 128 (*) 70 - 99 mg/dL   BUN 17  6 - 23 mg/dL   Creatinine, Ser 4.09  0.50 - 1.35 mg/dL   Calcium 81.1  8.4 - 91.4 mg/dL   Total Protein 7.7  6.0 - 8.3 g/dL   Albumin 4.1  3.5 - 5.2 g/dL   AST 19  0 - 37 U/L   ALT 16  0 - 53 U/L   Alkaline Phosphatase 90  39 - 117 U/L   Total Bilirubin 0.7  0.3 - 1.2 mg/dL   GFR calc non Af Amer 78 (*) >90 mL/min   GFR calc Af Amer >90  >90 mL/min  D-DIMER, QUANTITATIVE      Component Value Range   D-Dimer, Quant 0.52 (*) 0.00 - 0.48 ug/mL-FEU  CBC WITH DIFFERENTIAL      Component Value Range   WBC 12.6 (*) 4.0 - 10.5 K/uL   RBC 5.04  4.22 - 5.81 MIL/uL   Hemoglobin 15.8  13.0 - 17.0 g/dL   HCT 78.2  95.6 - 21.3 %   MCV 90.3  78.0 - 100.0 fL   MCH 31.3  26.0 - 34.0 pg   MCHC 34.7  30.0 - 36.0 g/dL   RDW 08.6  57.8 - 46.9 %   Platelets 225  150 - 400 K/uL   Neutrophils Relative 82 (*) 43 - 77 %   Neutro Abs 10.4 (*) 1.7 - 7.7 K/uL   Lymphocytes Relative 10 (*) 12 - 46 %   Lymphs Abs 1.3  0.7 - 4.0 K/uL   Monocytes Relative 7  3 - 12 %   Monocytes Absolute 0.9  0.1 - 1.0 K/uL   Eosinophils Relative 0  0 - 5 %   Eosinophils Absolute 0.0  0.0 - 0.7 K/uL   Basophils Relative 0  0 - 1 %   Basophils Absolute 0.0  0.0 - 0.1 K/uL   Laboratory  interpretation  all normal except leukocytosis, minimally elevated ddimer  Ct Angio Chest W/cm &/or Wo Cm  07/24/2012  *RADIOLOGY REPORT*  Clinical Data: Right-sided pain.  History of deep venous thrombosis.  Question pulmonary embolism.  CT ANGIOGRAPHY CHEST  Technique:  Multidetector CT imaging of the chest using the standard protocol during bolus administration of intravenous contrast. Multiplanar reconstructed images including MIPs were obtained and reviewed to evaluate the vascular anatomy.  Contrast: OMNIPAQUE IOHEXOL 350 MG/ML SOLN  Comparison: Plain film of 07/24/2012.  No prior CT.  Findings: Lung windows demonstrate mild motion degradation throughout.  Mild degradation secondary patient body habitus.  Clear lungs.  Soft tissue windows:  The quality of this exam for evaluation of pulmonary embolism is poor to moderate. In addition to the above factors, bolus is suboptimally timed, centered in the SVC.  No central pulmonary embolism.  No lobar embolism.  No large segmental embolism, especially to the lower lobes.  Smaller emboli cannot be excluded.  Pulmonary artery enlargement, the outflow tract measuring 3.6 cm. No evidence of aortic aneurysm.  No gross dissection.  Bolus timing limits evaluation for dissection.  Moderate cardiomegaly, without pericardial or pleural effusion. No mediastinal or hilar adenopathy.  Limited abdominal imaging demonstrates mild hepatic steatosis. Gallstones. Suspect mild pericholecystic edema on the final image of the study.  Bilateral mild gynecomastia.  Mid thoracic hemangioma.  IMPRESSION:  1.  Suboptimal evaluation for pulmonary embolism.  No large embolism. 2.  Cholelithiasis.  Possible pericholecystic edema, incompletely imaged.  Given right-sided symptoms, ultrasound should be considered to exclude early acute cholecystitis. 3. Pulmonary artery enlargement suggests pulmonary arterial hypertension. 4.  Mild hepatic steatosis.   Original Report Authenticated By:  Jeronimo Greaves, M.D.    US Abdomen Limited Ruq  07/24/2012  *RADIOLOGY REPORT*  Clinical Data:  Right upper quadrant pain, nausea  LIMITED ABDOMINAL ULTRASOUND - RIGHT UPPER QUADRANT  Comparison:  None.  Findings:  Gallbladder:  There are a few gallstones present within the gallbladder with acoustical shadowing.  The largest gallstone measures 2.4 cm.  No pain is present over the gallbladder with compression.  Common bile duct:  The common bile duct is normal measuring 3.6 mm in diameter.  Liver:  The liver is diffusely echogenic consistent with fatty infiltration.  No focal abnormality is seen.  IMPRESSION:  1.  Several gallstones the largest of 2.4 cm.  No pain over the gallbladder currently. 2.  Diffuse fatty infiltration of the liver.                    Original Report Authenticated By: Dwyane Dee, M.D.     Date: 07/24/2012  Rate: 64  Rhythm: normal sinus rhythm  QRS Axis: normal  Intervals: PR prolonged  ST/T Wave abnormalities: normal  Conduction Disutrbances:none  Narrative Interpretation:   Old EKG Reviewed: changes noted from 08/03/2009    1. RUQ pain   2. Cholecystitis     Plan admission  Devoria Albe, MD, FACEP   MDM          Ward Givens, MD 07/24/12 2211

## 2012-07-24 NOTE — ED Notes (Signed)
Pain in right chest woke him up from sleep this morning around 0230 this morning.  Patient had positive D-Dimer at PCP's office and + for gall stones on his ABD [place] earlier today (no obstruction per patient).  Patient's PCP sent patient to ED for CTA to R/O clot.

## 2012-07-25 ENCOUNTER — Encounter (HOSPITAL_COMMUNITY): Admission: EM | Disposition: A | Discharge status: Home / Self Care | Payer: Self-pay | Source: Home / Self Care

## 2012-07-25 ENCOUNTER — Inpatient Hospital Stay (HOSPITAL_COMMUNITY): Payer: BC Managed Care – PPO

## 2012-07-25 ENCOUNTER — Encounter (HOSPITAL_COMMUNITY): Payer: Self-pay | Admitting: Certified Registered Nurse Anesthetist

## 2012-07-25 ENCOUNTER — Inpatient Hospital Stay (HOSPITAL_COMMUNITY): Payer: BC Managed Care – PPO | Admitting: Certified Registered Nurse Anesthetist

## 2012-07-25 HISTORY — PX: CHOLECYSTECTOMY: SHX55

## 2012-07-25 LAB — SURGICAL PCR SCREEN
MRSA, PCR: NEGATIVE
Staphylococcus aureus: POSITIVE — AB

## 2012-07-25 SURGERY — LAPAROSCOPIC CHOLECYSTECTOMY WITH INTRAOPERATIVE CHOLANGIOGRAM
Anesthesia: General | Site: Abdomen | Wound class: Dirty or Infected

## 2012-07-25 MED ORDER — SODIUM CHLORIDE 0.9 % IV SOLN
3.0000 g | Freq: Four times a day (QID) | INTRAVENOUS | Status: DC
  Administered 2012-07-25 – 2012-07-27 (×10): 3 g via INTRAVENOUS
  Filled 2012-07-25 (×12): qty 3

## 2012-07-25 MED ORDER — GLYCOPYRROLATE 0.2 MG/ML IJ SOLN
INTRAMUSCULAR | Status: DC | PRN
  Administered 2012-07-25: 0.4 mg via INTRAVENOUS

## 2012-07-25 MED ORDER — MIDAZOLAM HCL 5 MG/5ML IJ SOLN
INTRAMUSCULAR | Status: DC | PRN
  Administered 2012-07-25: 2 mg via INTRAVENOUS

## 2012-07-25 MED ORDER — PROMETHAZINE HCL 25 MG/ML IJ SOLN: 6.2500 mg | INTRAMUSCULAR | Status: DC | PRN

## 2012-07-25 MED ORDER — HYDROMORPHONE HCL PF 1 MG/ML IJ SOLN
0.2500 mg | INTRAMUSCULAR | Status: DC | PRN
  Administered 2012-07-25 (×4): 0.5 mg via INTRAVENOUS

## 2012-07-25 MED ORDER — ACETAMINOPHEN 10 MG/ML IV SOLN
INTRAVENOUS | Status: DC | PRN
  Administered 2012-07-25: 1000 mg via INTRAVENOUS

## 2012-07-25 MED ORDER — NEOSTIGMINE METHYLSULFATE 1 MG/ML IJ SOLN
INTRAMUSCULAR | Status: DC | PRN
  Administered 2012-07-25: 3 mg via INTRAVENOUS

## 2012-07-25 MED ORDER — IOHEXOL 300 MG/ML  SOLN
INTRAMUSCULAR | Status: DC | PRN
  Administered 2012-07-25: 13 mL via INTRAVENOUS

## 2012-07-25 MED ORDER — MORPHINE SULFATE 2 MG/ML IJ SOLN: 2.0000 mg | INTRAMUSCULAR | Status: DC | PRN

## 2012-07-25 MED ORDER — LIDOCAINE HCL (CARDIAC) 20 MG/ML IV SOLN
INTRAVENOUS | Status: DC | PRN
  Administered 2012-07-25: 80 mg via INTRAVENOUS

## 2012-07-25 MED ORDER — BUPIVACAINE-EPINEPHRINE 0.25% -1:200000 IJ SOLN
INTRAMUSCULAR | Status: DC | PRN
  Administered 2012-07-25: 20 mL

## 2012-07-25 MED ORDER — KCL IN DEXTROSE-NACL 20-5-0.9 MEQ/L-%-% IV SOLN
INTRAVENOUS | Status: DC
  Administered 2012-07-25 – 2012-07-26 (×3): via INTRAVENOUS
  Administered 2012-07-26: 100 mL/h via INTRAVENOUS
  Administered 2012-07-27: 03:00:00 via INTRAVENOUS
  Filled 2012-07-25 (×7): qty 1000

## 2012-07-25 MED ORDER — LACTATED RINGERS IV SOLN: INTRAVENOUS | Status: DC

## 2012-07-25 MED ORDER — ROCURONIUM BROMIDE 100 MG/10ML IV SOLN
INTRAVENOUS | Status: DC | PRN
  Administered 2012-07-25: 10 mg via INTRAVENOUS
  Administered 2012-07-25: 50 mg via INTRAVENOUS
  Administered 2012-07-25 (×4): 10 mg via INTRAVENOUS

## 2012-07-25 MED ORDER — ONDANSETRON HCL 4 MG/2ML IJ SOLN
INTRAMUSCULAR | Status: DC | PRN
  Administered 2012-07-25: 4 mg via INTRAVENOUS

## 2012-07-25 MED ORDER — FENTANYL CITRATE 0.05 MG/ML IJ SOLN
INTRAMUSCULAR | Status: DC | PRN
  Administered 2012-07-25: 100 ug via INTRAVENOUS
  Administered 2012-07-25: 50 ug via INTRAVENOUS
  Administered 2012-07-25: 100 ug via INTRAVENOUS

## 2012-07-25 MED ORDER — PANTOPRAZOLE SODIUM 40 MG IV SOLR
40.0000 mg | Freq: Every day | INTRAVENOUS | Status: DC
  Administered 2012-07-25 – 2012-07-26 (×3): 40 mg via INTRAVENOUS
  Filled 2012-07-25 (×4): qty 40

## 2012-07-25 MED ORDER — LACTATED RINGERS IV SOLN
INTRAVENOUS | Status: DC | PRN
  Administered 2012-07-25 (×2): via INTRAVENOUS

## 2012-07-25 MED ORDER — OXYCODONE-ACETAMINOPHEN 5-325 MG PO TABS
1.0000 | ORAL_TABLET | ORAL | Status: DC | PRN
  Administered 2012-07-26 (×2): 1 via ORAL
  Filled 2012-07-25 (×2): qty 1

## 2012-07-25 MED ORDER — PROPOFOL 10 MG/ML IV BOLUS
INTRAVENOUS | Status: DC | PRN
  Administered 2012-07-25: 280 mg via INTRAVENOUS

## 2012-07-25 MED ORDER — 0.9 % SODIUM CHLORIDE (POUR BTL) OPTIME
TOPICAL | Status: DC | PRN
  Administered 2012-07-25: 1000 mL

## 2012-07-25 MED ORDER — LACTATED RINGERS IR SOLN
Status: DC | PRN
  Administered 2012-07-25: 1

## 2012-07-25 MED ORDER — ONDANSETRON HCL 4 MG/2ML IJ SOLN: 4.0000 mg | Freq: Four times a day (QID) | INTRAMUSCULAR | Status: DC | PRN

## 2012-07-25 MED ORDER — SUCCINYLCHOLINE CHLORIDE 20 MG/ML IJ SOLN
INTRAMUSCULAR | Status: DC | PRN
  Administered 2012-07-25: 140 mg via INTRAVENOUS

## 2012-07-25 MED ORDER — HEPARIN SODIUM (PORCINE) 5000 UNIT/ML IJ SOLN
5000.0000 [IU] | Freq: Three times a day (TID) | INTRAMUSCULAR | Status: DC
  Administered 2012-07-25 – 2012-07-27 (×6): 5000 [IU] via SUBCUTANEOUS
  Filled 2012-07-25 (×11): qty 1

## 2012-07-25 SURGICAL SUPPLY — 43 items
ADH SKN CLS APL DERMABOND .7 (GAUZE/BANDAGES/DRESSINGS) ×2
APL SKNCLS STERI-STRIP NONHPOA (GAUZE/BANDAGES/DRESSINGS)
APPLIER CLIP ROT 10 11.4 M/L (STAPLE) ×2
APR CLP MED LRG 11.4X10 (STAPLE) ×1
BAG SPEC RTRVL LRG 6X4 10 (ENDOMECHANICALS) ×1
BENZOIN TINCTURE PRP APPL 2/3 (GAUZE/BANDAGES/DRESSINGS) IMPLANT
CANISTER SUCTION 2500CC (MISCELLANEOUS) ×2 IMPLANT
CLIP APPLIE ROT 10 11.4 M/L (STAPLE) ×1 IMPLANT
CLOTH BEACON ORANGE TIMEOUT ST (SAFETY) ×2 IMPLANT
COVER MAYO STAND STRL (DRAPES) ×2 IMPLANT
DECANTER SPIKE VIAL GLASS SM (MISCELLANEOUS) ×2 IMPLANT
DERMABOND ADVANCED (GAUZE/BANDAGES/DRESSINGS) ×2
DERMABOND ADVANCED .7 DNX12 (GAUZE/BANDAGES/DRESSINGS) ×2 IMPLANT
DEVICE TROCAR PUNCTURE CLOSURE (ENDOMECHANICALS) ×2 IMPLANT
DRAPE C-ARM 42X72 X-RAY (DRAPES) ×2 IMPLANT
DRAPE LAPAROSCOPIC ABDOMINAL (DRAPES) ×2 IMPLANT
ELECT REM PT RETURN 9FT ADLT (ELECTROSURGICAL) ×2
ELECTRODE REM PT RTRN 9FT ADLT (ELECTROSURGICAL) ×1 IMPLANT
GLOVE BIOGEL PI IND STRL 7.0 (GLOVE) ×1 IMPLANT
GLOVE BIOGEL PI INDICATOR 7.0 (GLOVE) ×1
GLOVE SS BIOGEL STRL SZ 7.5 (GLOVE) ×1 IMPLANT
GLOVE SUPERSENSE BIOGEL SZ 7.5 (GLOVE) ×1
GOWN STRL NON-REIN LRG LVL3 (GOWN DISPOSABLE) ×2 IMPLANT
GOWN STRL REIN XL XLG (GOWN DISPOSABLE) ×4 IMPLANT
HEMOSTAT SURGICEL 4X8 (HEMOSTASIS) ×2 IMPLANT
IV SET EXT 30 76VOL 4 MALE LL (IV SETS) IMPLANT
KIT BASIN OR (CUSTOM PROCEDURE TRAY) ×2 IMPLANT
NS IRRIG 1000ML POUR BTL (IV SOLUTION) ×2 IMPLANT
POUCH SPECIMEN RETRIEVAL 10MM (ENDOMECHANICALS) ×2 IMPLANT
SET CHOLANGIOGRAPH MIX (MISCELLANEOUS) ×2 IMPLANT
SET IRRIG TUBING LAPAROSCOPIC (IRRIGATION / IRRIGATOR) ×2 IMPLANT
SLEEVE Z-THREAD 5X100MM (TROCAR) ×2 IMPLANT
SOLUTION ANTI FOG 6CC (MISCELLANEOUS) ×2 IMPLANT
STOPCOCK K 69 2C6206 (IV SETS) IMPLANT
STRIP CLOSURE SKIN 1/2X4 (GAUZE/BANDAGES/DRESSINGS) IMPLANT
SUT MNCRL AB 4-0 PS2 18 (SUTURE) ×2 IMPLANT
TOWEL OR 17X26 10 PK STRL BLUE (TOWEL DISPOSABLE) ×4 IMPLANT
TOWEL OR NON WOVEN STRL DISP B (DISPOSABLE) ×2 IMPLANT
TRAY LAP CHOLE (CUSTOM PROCEDURE TRAY) ×2 IMPLANT
TROCAR HASSON GELL 12X100 (TROCAR) ×2 IMPLANT
TROCAR Z-THREAD FIOS 11X100 BL (TROCAR) ×4 IMPLANT
TROCAR Z-THREAD FIOS 5X100MM (TROCAR) ×2 IMPLANT
TUBING INSUFFLATION 10FT LAP (TUBING) ×2 IMPLANT

## 2012-07-25 NOTE — Anesthesia Preprocedure Evaluation (Signed)
Anesthesia Evaluation  Patient identified by MRN, date of birth, ID band Patient awake    Reviewed: Allergy & Precautions, H&P , NPO status , Patient's Chart, lab work & pertinent test results  Airway Mallampati: II TM Distance: >3 FB Neck ROM: Full    Dental  (+) Teeth Intact and Dental Advisory Given   Pulmonary sleep apnea and Continuous Positive Airway Pressure Ventilation ,  breath sounds clear to auscultation  Pulmonary exam normal       Cardiovascular negative cardio ROS  Rhythm:Regular Rate:Normal     Neuro/Psych negative neurological ROS  negative psych ROS   GI/Hepatic Neg liver ROS, hiatal hernia,   Endo/Other  Morbid obesity  Renal/GU negative Renal ROS  negative genitourinary   Musculoskeletal negative musculoskeletal ROS (+)   Abdominal (+) + obese,   Peds negative pediatric ROS (+)  Hematology negative hematology ROS (+)   Anesthesia Other Findings   Reproductive/Obstetrics negative OB ROS                           Anesthesia Physical Anesthesia Plan  ASA: III and emergent  Anesthesia Plan: General   Post-op Pain Management:    Induction: Intravenous  Airway Management Planned: Oral ETT  Additional Equipment:   Intra-op Plan:   Post-operative Plan: Extubation in OR  Informed Consent: I have reviewed the patients History and Physical, chart, labs and discussed the procedure including the risks, benefits and alternatives for the proposed anesthesia with the patient or authorized representative who has indicated his/her understanding and acceptance.   Dental advisory given  Plan Discussed with: CRNA  Anesthesia Plan Comments:         Anesthesia Quick Evaluation

## 2012-07-25 NOTE — Transfer of Care (Signed)
Immediate Anesthesia Transfer of Care Note  Patient: Connor King  Procedure(s) Performed: Procedure(s) (LRB) with comments: LAPAROSCOPIC CHOLECYSTECTOMY WITH INTRAOPERATIVE CHOLANGIOGRAM (N/A)  Patient Location: PACU  Anesthesia Type:General  Level of Consciousness: awake and alert   Airway & Oxygen Therapy: Patient Spontanous Breathing and Patient connected to face mask oxygen  Post-op Assessment: Report given to PACU RN and Post -op Vital signs reviewed and stable  Post vital signs: Reviewed and stable  Complications: No apparent anesthesia complications

## 2012-07-25 NOTE — Op Note (Signed)
Preoperative Diagnosis: cholelithiasis and acute cholecystitis  Postoprative Diagnosis: Same  Procedure: Procedure(s): LAPAROSCOPIC CHOLECYSTECTOMY WITH INTRAOPERATIVE CHOLANGIOGRAM   Surgeon: Glenna Fellows T   Assistants: Luretha Murphy  Anesthesia:  General endotracheal anesthesiaDiagnos  Indications:   Patient is a 61 year old male who presents with 24 hours of acute persistent right upper quadrant subcostal abdominal pain. Ultrasound has shown multiple gallstones. He also had a CT scan which shows thickening and fluid around the gallbladder. He has an elevated white count and tenderness. I recommend proceeding with emergency laparoscopic and possible open cholecystectomy for acute cholecystitis. The procedure and risks have been discussed extensively elsewhere.  Procedure Detail:  Patient was brought to the operating room, placed in the supine position on the operating table, and general endotracheal anesthesia induced. The abdomen was widely sterilely prepped and draped. He was already on broad-spectrum IV antibiotics. Patient time out was performed and correct procedure verified. The patient had a previous midline incision extending up above the umbilicus for colectomy and then subsequently a laparoscopic ventral hernia repair. I therefore elected to gain access with a 5 mm Optiview trocar in the right upper quadrant midclavicular line which was done without difficulty and pneumoperitoneum established. I then under direct vision was able to place a 12 mm trocar subxiphoid. These 2 trochars we cleared anterior abdominal adhesions which were all omentum further off the right upper quadrant placing a more lateral 5 mm trocar and then cleared adhesions down to the edge of the mesh toward the umbilicus at which point we placed an 11 mm trocar for the camera. This provided excellent visualization of the gallbladder. The gallbladder was severely acutely inflamed with edematous changes. It was  fatty encased. Also distended. The fundus was grasped and elevated up over the liver. Extensive fibrofatty tissue was taken off of the gallbladder and stripped out toward the porta hepatis. The gallbladder wall was exposed which was gangrenous. There was some spillage of bile which was clear bile. Dissection continued down along the infundibulum and at this point the tissue was very edematous and easily dissected and close triangle and the gallbladder was thoroughly dissected and the cystic artery and cystic duct were identified and the cystic duct gallbladder junction identified 360. The cystic duct was clipped an operative cholangiogram obtained through the cystic duct which showed good filling of a normal common bile duct and intrahepatic ducts with free flow into the duodenum and no filling defects. Following this the Reddick catheter was removed and the cystic duct was further clipped and divided and the cystic artery further clipped and divided. The gallbladder was dissected free from its bed using hook cautery placed in an Endo Catch bag. The gallbladder was brought up to the lower more midline trocar site in the abdominal wall here was dilated and the gallbladder removed piecemeal. This site was then closed with a 0 Vicryl suture placed with an Endo Close. The right upper quadrant was thoroughly irrigated and hemostasis obtained. A Surgicel pack was placed in the gallbladder bed. A closed suction drain was left in Morison's pouch and brought out through a lateral trocar sites. There was no evidence of bleeding or bile leak or trocar injury. All CO2 was evacuated. Skin incisions closed with staples. Sponge needle and instrument counts were correct.  Findings: Acute gangrenous cholecystitis  Estimated Blood Loss:  less than 100 mL         Drains: Blake drain in right upper quadrant  Blood Given: none  Specimens: gallbladder and contents        Complications:  * No complications entered in  OR log *         Disposition: PACU - hemodynamically stable.         Condition: stable

## 2012-07-25 NOTE — Anesthesia Postprocedure Evaluation (Signed)
  Anesthesia Post-op Note  Patient: Connor King  Procedure(s) Performed: Procedure(s) (LRB) with comments: LAPAROSCOPIC CHOLECYSTECTOMY WITH INTRAOPERATIVE CHOLANGIOGRAM (N/A)  Patient Location: PACU  Anesthesia Type:General  Level of Consciousness: awake and alert   Airway and Oxygen Therapy: Patient Spontanous Breathing and Patient connected to face mask oxygen  Post-op Pain: none  Post-op Assessment: Post-op Vital signs reviewed and Patient's Cardiovascular Status Stable  Post-op Vital Signs: Reviewed and stable  Complications: No apparent anesthesia complications

## 2012-07-26 LAB — COMPREHENSIVE METABOLIC PANEL
AST: 23 U/L (ref 0–37)
Albumin: 3.2 g/dL — ABNORMAL LOW (ref 3.5–5.2)
Calcium: 8.7 mg/dL (ref 8.4–10.5)
Creatinine, Ser: 1.04 mg/dL (ref 0.50–1.35)
GFR calc non Af Amer: 76 mL/min — ABNORMAL LOW (ref >90)

## 2012-07-26 LAB — CBC
MCH: 31 pg (ref 26.0–34.0)
MCHC: 33.2 g/dL (ref 30.0–36.0)
MCV: 93.1 fL (ref 78.0–100.0)
Platelets: 166 10*3/uL (ref 150–400)
RDW: 13.6 % (ref 11.5–15.5)

## 2012-07-26 NOTE — Progress Notes (Signed)
Patient ID: Connor King, male   DOB: 11-25-1950, 61 y.o.   MRN: [id number] [facility] Surgery Progress Note:   1 Day Post-Op  Subjective: Mental status is clear.  Using CPAP Objective: Vital signs in last 24 hours: Temp:  [97.6 F (36.4 C)-98.9 F (37.2 C)] 98.2 F (36.8 C) (11/24 0515) Pulse Rate:  [45-77] 61  (11/24 0515) Resp:  [12-20] 18  (11/24 0515) BP: (102-149)/(40-97) 131/69 mmHg (11/24 0515) SpO2:  [90 %-100 %] 100 % (11/24 0515)  Intake/Output from previous day: 11/23 0701 - 11/24 0700 In: 3786.7 [P.O.:120; I.V.:3126.7; IV Piggyback:500] Out: 695 [Urine:650; Drains:45] Intake/Output this shift:    Physical Exam: Work of breathing is not labored.  JP is serosanguinous.    Lab Results:  Results for orders placed during the hospital encounter of 07/24/12 (from the past 48 hour(s))  TROPONIN I     Status: Normal   Collection Time   07/24/12  6:26 PM      Component Value Range Comment   Troponin I <0.30  <0.30 ng/mL   COMPREHENSIVE METABOLIC PANEL     Status: Abnormal   Collection Time   07/24/12  6:40 PM      Component Value Range Comment   Sodium 139  135 - 145 mEq/L    Potassium 3.6  3.5 - 5.1 mEq/L    Chloride 99  96 - 112 mEq/L    CO2 26  19 - 32 mEq/L    Glucose, Bld 128 (*) 70 - 99 mg/dL    BUN 17  6 - 23 mg/dL    Creatinine, Ser 6.57  0.50 - 1.35 mg/dL    Calcium 84.6  8.4 - 10.5 mg/dL    Total Protein 7.7  6.0 - 8.3 g/dL    Albumin 4.1  3.5 - 5.2 g/dL    AST 19  0 - 37 U/L    ALT 16  0 - 53 U/L    Alkaline Phosphatase 90  39 - 117 U/L    Total Bilirubin 0.7  0.3 - 1.2 mg/dL    GFR calc non Af Amer 78 (*) >90 mL/min    GFR calc Af Amer >90  >90 mL/min   D-DIMER, QUANTITATIVE     Status: Abnormal   Collection Time   07/24/12  6:40 PM      Component Value Range Comment   D-Dimer, Quant 0.52 (*) 0.00 - 0.48 ug/mL-FEU   CBC WITH DIFFERENTIAL     Status: Abnormal   Collection Time   07/24/12  6:40 PM      Component Value Range Comment   WBC  12.6 (*) 4.0 - 10.5 K/uL    RBC 5.04  4.22 - 5.81 MIL/uL    Hemoglobin 15.8  13.0 - 17.0 g/dL    HCT 96.2  95.2 - 84.1 %    MCV 90.3  78.0 - 100.0 fL    MCH 31.3  26.0 - 34.0 pg    MCHC 34.7  30.0 - 36.0 g/dL    RDW 32.4  40.1 - 02.7 %    Platelets 225  150 - 400 K/uL    Neutrophils Relative 82 (*) 43 - 77 %    Neutro Abs 10.4 (*) 1.7 - 7.7 K/uL    Lymphocytes Relative 10 (*) 12 - 46 %    Lymphs Abs 1.3  0.7 - 4.0 K/uL    Monocytes Relative 7  3 - 12 %    Monocytes Absolute 0.9  0.1 - 1.0 K/uL    Eosinophils Relative 0  0 - 5 %    Eosinophils Absolute 0.0  0.0 - 0.7 K/uL    Basophils Relative 0  0 - 1 %    Basophils Absolute 0.0  0.0 - 0.1 K/uL   SURGICAL PCR SCREEN     Status: Abnormal   Collection Time   07/25/12  8:55 AM      Component Value Range Comment   MRSA, PCR NEGATIVE  NEGATIVE    Staphylococcus aureus POSITIVE (*) NEGATIVE   CBC     Status: Normal   Collection Time   07/26/12  4:30 AM      Component Value Range Comment   WBC 10.5  4.0 - 10.5 K/uL    RBC 4.23  4.22 - 5.81 MIL/uL    Hemoglobin 13.2  13.0 - 17.0 g/dL    HCT 16.1  09.6 - 04.5 %    MCV 93.1  78.0 - 100.0 fL    MCH 31.0  26.0 - 34.0 pg    MCHC 33.2  30.0 - 36.0 g/dL    RDW 40.9  81.1 - 91.4 %    Platelets 166  150 - 400 K/uL   COMPREHENSIVE METABOLIC PANEL     Status: Abnormal   Collection Time   07/26/12  4:30 AM      Component Value Range Comment   Sodium 136  135 - 145 mEq/L    Potassium 4.3  3.5 - 5.1 mEq/L    Chloride 101  96 - 112 mEq/L    CO2 29  19 - 32 mEq/L    Glucose, Bld 166 (*) 70 - 99 mg/dL    BUN 13  6 - 23 mg/dL    Creatinine, Ser 7.82  0.50 - 1.35 mg/dL    Calcium 8.7  8.4 - 95.6 mg/dL    Total Protein 6.3  6.0 - 8.3 g/dL    Albumin 3.2 (*) 3.5 - 5.2 g/dL    AST 23  0 - 37 U/L    ALT 16  0 - 53 U/L    Alkaline Phosphatase 74  39 - 117 U/L    Total Bilirubin 0.5  0.3 - 1.2 mg/dL    GFR calc non Af Amer 76 (*) >90 mL/min    GFR calc Af Amer 88 (*) >90 mL/min      Radiology/Results: Dg Cholangiogram Operative  07/25/2012  *RADIOLOGY REPORT*  Clinical Data:   Cholecystectomy for cholelithiasis.  INTRAOPERATIVE CHOLANGIOGRAM  Technique:  Cholangiographic images from the C-arm fluoroscopic device were submitted for interpretation post-operatively.  Please see the procedural report for the amount of contrast and the fluoroscopy time utilized.  Comparison:  Ultrasound dated 07/24/2012  Findings:  Intraoperative cholangiogram obtained with a C-arm demonstrates normal opacification of the cystic duct, common bile duct and major intrahepatic bile ducts.  No evidence of biliary dilatation, filling defects or contrast extravasation.  Contrast is seen to flow freely into the duodenum.  IMPRESSION: Normal intraoperative cholangiogram.   Original Report Authenticated By: Irish Lack, M.D.    Ct Angio Chest W/cm &/or Wo Cm  07/24/2012  *RADIOLOGY REPORT*  Clinical Data: Right-sided pain.  History of deep venous thrombosis.  Question pulmonary embolism.  CT ANGIOGRAPHY CHEST  Technique:  Multidetector CT imaging of the chest using the standard protocol during bolus administration of intravenous contrast. Multiplanar reconstructed images including MIPs were obtained and reviewed to evaluate the vascular anatomy.  Contrast: OMNIPAQUE IOHEXOL  350 MG/ML SOLN  Comparison: Plain film of 07/24/2012.  No prior CT.  Findings: Lung windows demonstrate mild motion degradation throughout.  Mild degradation secondary patient body habitus.  Clear lungs.  Soft tissue windows:  The quality of this exam for evaluation of pulmonary embolism is poor to moderate. In addition to the above factors, bolus is suboptimally timed, centered in the SVC.  No central pulmonary embolism.  No lobar embolism.  No large segmental embolism, especially to the lower lobes.  Smaller emboli cannot be excluded.  Pulmonary artery enlargement, the outflow tract measuring 3.6 cm. No evidence of aortic aneurysm.   No gross dissection.  Bolus timing limits evaluation for dissection.  Moderate cardiomegaly, without pericardial or pleural effusion. No mediastinal or hilar adenopathy.  Limited abdominal imaging demonstrates mild hepatic steatosis. Gallstones. Suspect mild pericholecystic edema on the final image of the study.  Bilateral mild gynecomastia.  Mid thoracic hemangioma.  IMPRESSION:  1.  Suboptimal evaluation for pulmonary embolism.  No large embolism. 2.  Cholelithiasis.  Possible pericholecystic edema, incompletely imaged.  Given right-sided symptoms, ultrasound should be considered to exclude early acute cholecystitis. 3. Pulmonary artery enlargement suggests pulmonary arterial hypertension. 4.  Mild hepatic steatosis.   Original Report Authenticated By: Jeronimo Greaves, M.D.    US Abdomen Limited Ruq  07/24/2012  *RADIOLOGY REPORT*  Clinical Data:  Right upper quadrant pain, nausea  LIMITED ABDOMINAL ULTRASOUND - RIGHT UPPER QUADRANT  Comparison:  None.  Findings:  Gallbladder:  There are a few gallstones present within the gallbladder with acoustical shadowing.  The largest gallstone measures 2.4 cm.  No pain is present over the gallbladder with compression.  Common bile duct:  The common bile duct is normal measuring 3.6 mm in diameter.  Liver:  The liver is diffusely echogenic consistent with fatty infiltration.  No focal abnormality is seen.  IMPRESSION:  1.  Several gallstones the largest of 2.4 cm.  No pain over the gallbladder currently. 2.  Diffuse fatty infiltration of the liver.                    Original Report Authenticated By: Dwyane Dee, M.D.     Anti-infectives: Anti-infectives     Start     Dose/Rate Route Frequency Ordered Stop   07/25/12 0300   Ampicillin-Sulbactam (UNASYN) 3 g in sodium chloride 0.9 % 100 mL IVPB        3 g 100 mL/hr over 60 Minutes Intravenous Every 6 hours 07/25/12 0008     07/24/12 2100   Ampicillin-Sulbactam (UNASYN) 3 g in sodium chloride 0.9 % 100 mL IVPB         3 g 100 mL/hr over 60 Minutes Intravenous  Once 07/24/12 2056 07/24/12 2258          Assessment/Plan: Problem List: There is no problem list on file for this patient.   Morbidly obese man with acute cholecystitis; not ready for discharge 1 Day Post-Op    LOS: 2 days   Matt B. Daphine Deutscher, MD, [facility] [phone] beeper [phone]  07/26/2012 8:21 AM

## 2012-07-27 ENCOUNTER — Encounter (HOSPITAL_COMMUNITY): Payer: Self-pay | Admitting: General Surgery

## 2012-07-27 LAB — COMPREHENSIVE METABOLIC PANEL
BUN: 9 mg/dL (ref 6–23)
CO2: 29 mEq/L (ref 19–32)
Calcium: 8.5 mg/dL (ref 8.4–10.5)
Creatinine, Ser: 1.01 mg/dL (ref 0.50–1.35)
GFR calc Af Amer: >90 mL/min (ref >90)
GFR calc non Af Amer: 78 mL/min — ABNORMAL LOW (ref >90)
Glucose, Bld: 139 mg/dL — ABNORMAL HIGH (ref 70–99)

## 2012-07-27 LAB — CBC
Hemoglobin: 12.6 g/dL — ABNORMAL LOW (ref 13.0–17.0)
MCH: 30.7 pg (ref 26.0–34.0)
MCV: 92.9 fL (ref 78.0–100.0)
RBC: 4.1 MIL/uL — ABNORMAL LOW (ref 4.22–5.81)

## 2012-07-27 MED ORDER — AMOXICILLIN-POT CLAVULANATE 875-125 MG PO TABS: 1.0000 | ORAL_TABLET | Freq: Two times a day (BID) | ORAL | Status: DC

## 2012-07-27 MED ORDER — OXYCODONE-ACETAMINOPHEN 5-325 MG PO TABS: 1.0000 | ORAL_TABLET | Freq: Four times a day (QID) | ORAL | Status: DC | PRN

## 2012-07-27 NOTE — Care Management Note (Signed)
    Page 1 of 1   07/27/2012     11:20:19 AM   CARE MANAGEMENT NOTE 07/27/2012  Patient:  Connor King, Connor King   Account Number:  [id number]  Date Initiated:  07/27/2012  Documentation initiated by:  Lorenda Ishihara  Subjective/Objective Assessment:   61 yo male admitted s/p lap chole. PTA lived at home with spouse.     Action/Plan:   Home when stable   Anticipated DC Date:  07/27/2012   Anticipated DC Plan:  HOME/SELF CARE      DC Planning Services  CM consult      Choice offered to / List presented to:             Status of service:  Completed, signed off Medicare Important Message given?   (If response is "NO", the following Medicare IM given date fields will be blank) Date Medicare IM given:   Date Additional Medicare IM given:    Discharge Disposition:  HOME/SELF CARE  Per UR Regulation:  Reviewed for med. necessity/level of care/duration of stay  If discussed at Long Length of Stay Meetings, dates discussed:    Comments:

## 2012-07-27 NOTE — Discharge Instructions (Signed)
[facility] ______[facility] °LAPAROSCOPIC SURGERY: POST OP INSTRUCTIONS °Always review your discharge instruction sheet given to you by the facility where your surgery was performed. °IF YOU HAVE DISABILITY OR FAMILY LEAVE FORMS, YOU MUST BRING THEM TO THE OFFICE FOR PROCESSING.   °DO NOT GIVE THEM TO YOUR DOCTOR. ° °1. A prescription for pain medication may be given to you upon discharge.  Take your pain medication as prescribed, if needed.  If narcotic pain medicine is not needed, then you may take acetaminophen (Tylenol) or ibuprofen (Advil) as needed. °2. Take your usually prescribed medications unless otherwise directed. °3. If you need a refill on your pain medication, please contact your pharmacy.  They will contact our office to request authorization. Prescriptions will not be filled after 5pm or on week-ends. °4. You should follow a light diet the first few days after arrival home, such as soup and crackers, etc.  Be sure to include lots of fluids daily. °5. Most patients will experience some swelling and bruising in the area of the incisions.  Ice packs will help.  Swelling and bruising can take several days to resolve.  °6. It is common to experience some constipation if taking pain medication after surgery.  Increasing fluid intake and taking a stool softener (such as Colace) will usually help or prevent this problem from occurring.  A mild laxative (Milk of Magnesia or Miralax) should be taken according to package instructions if there are no bowel movements after 48 hours. °7. Unless discharge instructions indicate otherwise, you may remove your bandages 24-48 hours after surgery, and you may shower at that time.  You may have steri-strips (small skin tapes) in place directly over the incision.  These strips should be left on the skin for 7-10 days.  If your surgeon used skin glue on the incision, you may shower in 24 hours.  The glue will flake off over the next 2-3 weeks.  Any sutures or  staples will be removed at the office during your follow-up visit. °8. ACTIVITIES:  You may resume regular (light) daily activities beginning the next day--such as daily self-care, walking, climbing stairs--gradually increasing activities as tolerated.  You may have sexual intercourse when it is comfortable.  Refrain from any heavy lifting or straining until approved by your doctor. °a. You may drive when you are no longer taking prescription pain medication, you can comfortably wear a seatbelt, and you can safely maneuver your car and apply brakes. °b. RETURN TO WORK:  __________________________________________________________ °9. You should see your doctor in the office for a follow-up appointment approximately 2-3 weeks after your surgery.  Make sure that you call for this appointment within a day or two after you arrive home to insure a convenient appointment time. °10. OTHER INSTRUCTIONS: __________________________________________________________________________________________________________________________ __________________________________________________________________________________________________________________________ °WHEN TO CALL YOUR DOCTOR: °1. Fever over 101.0 °2. Inability to urinate °3. Continued bleeding from incision. °4. Increased pain, redness, or drainage from the incision. °5. Increasing abdominal pain ° °The clinic staff is available to answer your questions during regular business hours.  Please don’t hesitate to call and ask to speak to one of the nurses for clinical concerns.  If you have a medical emergency, go to the nearest emergency room or call 911.  A surgeon from [facility] is always on call at the hospital. °[address], [address], [place], [place]  [address] ? [address] [address], [place], [place]   [address] °[phone] ? [phone] ? FAX [phone] °Web site:   [email or web address] °

## 2012-07-27 NOTE — Progress Notes (Signed)
Patient ID: Connor King, male   DOB: 1951-06-16, 61 y.o.   MRN: [id number] 2 Days Post-Op  Subjective: Pt wo complaints, denies n/v.  Tolerating full liquids well, wants to go home today, denies flatus or BM yet.  Objective: Vital signs in last 24 hours: Temp:  [99 F (37.2 C)-99.3 F (37.4 C)] 99 F (37.2 C) (11/25 0530) Pulse Rate:  [64-75] 64  (11/25 0530) Resp:  [18-20] 18  (11/25 0530) BP: (117-149)/(74-84) 126/78 mmHg (11/25 0530) SpO2:  [93 %-97 %] 97 % (11/25 0530) Last BM Date: 07/24/12  Intake/Output from previous day: 11/24 0701 - 11/25 0700 In: 2941.7 [I.V.:2531.7; IV Piggyback:400] Out: 310 [Urine:250; Drains:60] Intake/Output this shift:    PE: Abd: obese but mostly soft, minimal tenderness, drain with serosang output, incision c/d/i with staples, +BS  Lab Results:   Basename 07/27/12 0430 07/26/12 0430  WBC 8.1 10.5  HGB 12.6* 13.2  HCT 38.1* 39.4  PLT 158 166   BMET  Basename 07/27/12 0430 07/26/12 0430  NA 137 136  K 3.7 4.3  CL 103 101  CO2 29 29  GLUCOSE 139* 166*  BUN 9 13  CREATININE 1.01 1.04  CALCIUM 8.5 8.7   PT/INR No results found for this basename: LABPROT:2,INR:2 in the last 72 hours CMP     Component Value Date/Time   NA 137 07/27/2012 0430   K 3.7 07/27/2012 0430   CL 103 07/27/2012 0430   CO2 29 07/27/2012 0430   GLUCOSE 139* 07/27/2012 0430   BUN 9 07/27/2012 0430   CREATININE 1.01 07/27/2012 0430   CALCIUM 8.5 07/27/2012 0430   PROT 6.1 07/27/2012 0430   ALBUMIN 2.9* 07/27/2012 0430   AST 18 07/27/2012 0430   ALT 14 07/27/2012 0430   ALKPHOS 65 07/27/2012 0430   BILITOT 0.6 07/27/2012 0430   GFRNONAA 78* 07/27/2012 0430   GFRAA >90 07/27/2012 0430   Lipase  No results found for this basename: lipase       Studies/Results: Dg Cholangiogram Operative  07/25/2012  *RADIOLOGY REPORT*  Clinical Data:   Cholecystectomy for cholelithiasis.  INTRAOPERATIVE CHOLANGIOGRAM  Technique:  Cholangiographic images from  the C-arm fluoroscopic device were submitted for interpretation post-operatively.  Please see the procedural report for the amount of contrast and the fluoroscopy time utilized.  Comparison:  Ultrasound dated 07/24/2012  Findings:  Intraoperative cholangiogram obtained with a C-arm demonstrates normal opacification of the cystic duct, common bile duct and major intrahepatic bile ducts.  No evidence of biliary dilatation, filling defects or contrast extravasation.  Contrast is seen to flow freely into the duodenum.  IMPRESSION: Normal intraoperative cholangiogram.   Original Report Authenticated By: Irish Lack, M.D.     Anti-infectives: Anti-infectives     Start     Dose/Rate Route Frequency Ordered Stop   07/27/12 0000   amoxicillin-clavulanate (AUGMENTIN) 875-125 MG per tablet        1 tablet Oral 2 times daily 07/27/12 0900     07/25/12 0300   Ampicillin-Sulbactam (UNASYN) 3 g in sodium chloride 0.9 % 100 mL IVPB        3 g 100 mL/hr over 60 Minutes Intravenous Every 6 hours 07/25/12 0008     07/24/12 2100   Ampicillin-Sulbactam (UNASYN) 3 g in sodium chloride 0.9 % 100 mL IVPB        3 g 100 mL/hr over 60 Minutes Intravenous  Once 07/24/12 2056 07/24/12 2258           Assessment/Plan 1.  POD#2-lap cholecystectomy: doing well but no flatus or BM yet, if Dr. Abbey Chatters is ok will advance diet to regular today and plan discharge later this afternoon.   --d/c IVFs  --poss advance to regular diet this am  --poss discharge later this afternoon  --will send home on 3 more days of antibiotics  --likely home with drain and follow up Friday for staple removal and drain removal   LOS: 3 days    [name] 07/27/2012

## 2012-07-27 NOTE — Discharge Summary (Deleted)
He developed by lobar pneumonia and a mild case of acute pancreatitis in the postoperative period. He was treated with antibiotics and bowel rest and responded well to both of these measures. He'll be discharged on oral antibiotics. He also knows to continue his Lovenox injections and his Coumadin. He was advised to have his INR checked in 4-7 days.

## 2012-07-27 NOTE — Progress Notes (Signed)
Patient seen and examined.  Agree with PA's note. Will advance to a bland diet.  Will need 7 more days of oral antibiotics upon discharge.  Discharge instructions were discussed with him.

## 2012-07-27 NOTE — Discharge Summary (Signed)
Physician Discharge Summary  Patient ID: Connor King MRN: [id number] DOB/AGE: Jan 04, 1951 61 y.o.  Admit date: 07/24/2012 Discharge date: 07/27/2012  Admitting Diagnosis: Acute Cholecystitis and Cholelithiasis  Discharge Diagnosis Acute Cholecystitis and Cholelithiasis   Consultants None  Procedures Laparoscopic Cholecystectomy with [facility]  Hospital Course 61 yr old who presented to [facility] with right upper quadrant abdominal pain and nausea.  Workup showed acute cholecystitis and cholithiasis.  Patient was admitted and underwent procedure listed above.  The gallbladder was found to be gangrenous.  A drain was placed and staples were used to close his incisions.Tolerated procedure well and was transferred to the floor.  Diet was advanced as tolerated.  On POD#2, the patient was voiding well, tolerating diet, ambulating well, pain well controlled, vital signs stable, incisions c/d/i and felt stable for discharge home.  Patient will follow up in our office on Monday for drain removal and staple removal.  He knows to call with questions or concerns.    Medication List     As of 07/27/2012  9:01 AM    TAKE these medications         amoxicillin-clavulanate 875-125 MG per tablet   Commonly known as: AUGMENTIN   Take 1 tablet by mouth 2 (two) times daily.      oxyCODONE-acetaminophen 5-325 MG per tablet   Commonly known as: PERCOCET/ROXICET   Take 1-2 tablets by mouth every 6 (six) hours as needed.          Follow-up Information    Follow up with [facility] On 08/03/2012. (Arrive at 1:15 for 1:30 appointment for drain and staple removal. )    Contact information:   [address] [place] [address] [phone]      Follow up with Mariella Saa, MD. On 08/13/2012. (Arrive at 9:45 for 10:15 appointment to see Dr. Johna Sheriff)    Contact information:   [address] [place] [address] [place] [address] [phone]            Signed: Denny Levy [facility] [phone]  07/27/2012, 9:01 AM

## 2012-08-03 ENCOUNTER — Encounter (INDEPENDENT_AMBULATORY_CARE_PROVIDER_SITE_OTHER): Payer: Self-pay

## 2012-08-03 ENCOUNTER — Ambulatory Visit (INDEPENDENT_AMBULATORY_CARE_PROVIDER_SITE_OTHER): Payer: BC Managed Care – PPO

## 2012-08-03 VITALS — BP 111/76 | HR 77 | Temp 98.4°F | Resp 18 | Ht 74.0 in | Wt 348.6 lb

## 2012-08-03 DIAGNOSIS — Z4803 Encounter for change or removal of drains: Secondary | ICD-10-CM

## 2012-08-03 DIAGNOSIS — Z4889 Encounter for other specified surgical aftercare: Secondary | ICD-10-CM

## 2012-08-03 NOTE — Progress Notes (Signed)
Patient comes in today for staple & drain removal.  Patient s/p lap cholecystectomy w/IOC on 07/25/12.  JP Drain placed RUQ.  Patient reports having less than 10 mL's output the last 72 hour.  Staples removed, steri strips placed, incision intact patient tolerated well.  JP Drain removed, dry gauze placed over drain site.  Patient has follow up appointment on August 13, 2012 w/Dr. Johna Sheriff.

## 2012-08-13 ENCOUNTER — Ambulatory Visit (INDEPENDENT_AMBULATORY_CARE_PROVIDER_SITE_OTHER): Payer: BC Managed Care – PPO | Admitting: General Surgery

## 2012-08-13 ENCOUNTER — Encounter (INDEPENDENT_AMBULATORY_CARE_PROVIDER_SITE_OTHER): Payer: Self-pay | Admitting: General Surgery

## 2012-08-13 VITALS — BP 138/72 | HR 78 | Temp 97.6°F | Resp 18 | Ht 74.0 in | Wt 345.0 lb

## 2012-08-13 DIAGNOSIS — Z09 Encounter for follow-up examination after completed treatment for conditions other than malignant neoplasm: Secondary | ICD-10-CM

## 2012-08-13 NOTE — Progress Notes (Signed)
History: This returned several weeks following emergency laparoscopic cholecystectomy for acute cholecystitis. He was discharged on antibiotics which he completed. He denies any problems currently such as pain or fever or GI complaints.  Exam: BP 138/72  Pulse 78  Temp 97.6 F (36.4 C) (Temporal)  Resp 18  Ht 6\' 2"  (1.88 m)  Wt 345 lb (156.491 kg)  BMI 44.30 kg/m2 General: Obese but otherwise well-appearing Abdomen: Incisions are well-healed. Soft and nontender.  Pathology: Reveal cholelithiasis and cholecystitis  Assessment and plan: Doing well without complications identified. He is released to return as needed.

## 2012-08-13 NOTE — Patient Instructions (Addendum)
No activity or diet restrictions

## 2012-10-17 ENCOUNTER — Other Ambulatory Visit: Payer: Self-pay

## 2013-07-08 ENCOUNTER — Other Ambulatory Visit: Payer: Self-pay

## 2014-01-20 ENCOUNTER — Other Ambulatory Visit: Payer: Self-pay | Admitting: Dermatology

## 2014-08-22 ENCOUNTER — Other Ambulatory Visit: Payer: Self-pay | Admitting: Internal Medicine

## 2014-08-22 DIAGNOSIS — M7989 Other specified soft tissue disorders: Secondary | ICD-10-CM

## 2014-08-23 ENCOUNTER — Ambulatory Visit
Admission: RE | Admit: 2014-08-23 | Discharge: 2014-08-23 | Disposition: A | Discharge status: Home / Self Care | Payer: BC Managed Care – PPO | Source: Ambulatory Visit | Attending: Internal Medicine | Admitting: Internal Medicine

## 2014-08-23 ENCOUNTER — Other Ambulatory Visit: Payer: Self-pay | Admitting: Internal Medicine

## 2014-08-23 DIAGNOSIS — M7989 Other specified soft tissue disorders: Secondary | ICD-10-CM

## 2016-05-16 ENCOUNTER — Ambulatory Visit
Admission: RE | Admit: 2016-05-16 | Discharge: 2016-05-16 | Disposition: A | Discharge status: Home / Self Care | Payer: Medicare Other | Source: Ambulatory Visit | Attending: Internal Medicine | Admitting: Internal Medicine

## 2016-05-16 ENCOUNTER — Other Ambulatory Visit: Payer: Self-pay | Admitting: Internal Medicine

## 2016-05-16 ENCOUNTER — Inpatient Hospital Stay (HOSPITAL_COMMUNITY)
Admission: EM | Admit: 2016-05-16 | Discharge: 2016-05-20 | DRG: 176 | Disposition: A | Discharge status: Home / Self Care | Payer: Medicare Other | Attending: Internal Medicine | Admitting: Internal Medicine

## 2016-05-16 ENCOUNTER — Encounter (HOSPITAL_COMMUNITY): Payer: Self-pay

## 2016-05-16 DIAGNOSIS — Z7982 Long term (current) use of aspirin: Secondary | ICD-10-CM

## 2016-05-16 DIAGNOSIS — R739 Hyperglycemia, unspecified: Secondary | ICD-10-CM | POA: Diagnosis present

## 2016-05-16 DIAGNOSIS — Z86718 Personal history of other venous thrombosis and embolism: Secondary | ICD-10-CM | POA: Diagnosis not present

## 2016-05-16 DIAGNOSIS — Z96652 Presence of left artificial knee joint: Secondary | ICD-10-CM | POA: Diagnosis present

## 2016-05-16 DIAGNOSIS — I2699 Other pulmonary embolism without acute cor pulmonale: Principal | ICD-10-CM | POA: Diagnosis present

## 2016-05-16 DIAGNOSIS — I5189 Other ill-defined heart diseases: Secondary | ICD-10-CM | POA: Diagnosis present

## 2016-05-16 DIAGNOSIS — I8390 Asymptomatic varicose veins of unspecified lower extremity: Secondary | ICD-10-CM | POA: Diagnosis present

## 2016-05-16 DIAGNOSIS — I8393 Asymptomatic varicose veins of bilateral lower extremities: Secondary | ICD-10-CM | POA: Diagnosis present

## 2016-05-16 DIAGNOSIS — R0609 Other forms of dyspnea: Principal | ICD-10-CM

## 2016-05-16 DIAGNOSIS — Z85038 Personal history of other malignant neoplasm of large intestine: Secondary | ICD-10-CM

## 2016-05-16 DIAGNOSIS — G4733 Obstructive sleep apnea (adult) (pediatric): Secondary | ICD-10-CM | POA: Diagnosis present

## 2016-05-16 DIAGNOSIS — Z6841 Body Mass Index (BMI) 40.0 and over, adult: Secondary | ICD-10-CM | POA: Diagnosis not present

## 2016-05-16 HISTORY — DX: Prediabetes: R73.03

## 2016-05-16 HISTORY — DX: Other pulmonary embolism without acute cor pulmonale: I26.99

## 2016-05-16 LAB — COMPREHENSIVE METABOLIC PANEL
ALT: 14 U/L — ABNORMAL LOW (ref 17–63)
AST: 20 U/L (ref 15–41)
Albumin: 4 g/dL (ref 3.5–5.0)
Alkaline Phosphatase: 75 U/L (ref 38–126)
Anion gap: 8 (ref 5–15)
BILIRUBIN TOTAL: 0.9 mg/dL (ref 0.3–1.2)
BUN: 17 mg/dL (ref 6–20)
CHLORIDE: 103 mmol/L (ref 101–111)
CO2: 29 mmol/L (ref 22–32)
CREATININE: 1.11 mg/dL (ref 0.61–1.24)
Calcium: 9.5 mg/dL (ref 8.9–10.3)
Glucose, Bld: 121 mg/dL — ABNORMAL HIGH (ref 65–99)
Potassium: 4.2 mmol/L (ref 3.5–5.1)
SODIUM: 140 mmol/L (ref 135–145)
TOTAL PROTEIN: 7.1 g/dL (ref 6.5–8.1)

## 2016-05-16 LAB — TROPONIN I
TROPONIN I: 0.03 ng/mL — AB (ref <0.03)
Troponin I: 0.03 ng/mL (ref <0.03)

## 2016-05-16 LAB — CBC
HCT: 44.8 % (ref 39.0–52.0)
Hemoglobin: 14.8 g/dL (ref 13.0–17.0)
MCH: 31.2 pg (ref 26.0–34.0)
MCHC: 33 g/dL (ref 30.0–36.0)
MCV: 94.5 fL (ref 78.0–100.0)
PLATELETS: 171 10*3/uL (ref 150–400)
RBC: 4.74 MIL/uL (ref 4.22–5.81)
RDW: 13.9 % (ref 11.5–15.5)
WBC: 9.2 10*3/uL (ref 4.0–10.5)

## 2016-05-16 LAB — BRAIN NATRIURETIC PEPTIDE: B NATRIURETIC PEPTIDE 5: 156.9 pg/mL — AB (ref 0.0–100.0)

## 2016-05-16 LAB — APTT: aPTT: 32 seconds (ref 24–36)

## 2016-05-16 LAB — PROTIME-INR
INR: 1.09
PROTHROMBIN TIME: 14.1 s (ref 11.4–15.2)

## 2016-05-16 MED ORDER — IOPAMIDOL (ISOVUE-370) INJECTION 76%
75.0000 mL | Freq: Once | INTRAVENOUS | Status: AC | PRN
  Administered 2016-05-16: 75 mL via INTRAVENOUS

## 2016-05-16 MED ORDER — ACETAMINOPHEN 650 MG RE SUPP: 650.0000 mg | Freq: Four times a day (QID) | RECTAL | Status: DC | PRN

## 2016-05-16 MED ORDER — HEPARIN (PORCINE) IN NACL 100-0.45 UNIT/ML-% IJ SOLN
1950.0000 [IU]/h | INTRAMUSCULAR | Status: AC
  Administered 2016-05-16 (×2): 2100 [IU]/h via INTRAVENOUS
  Administered 2016-05-18 – 2016-05-19 (×3): 1950 [IU]/h via INTRAVENOUS
  Filled 2016-05-16 (×7): qty 250

## 2016-05-16 MED ORDER — HEPARIN BOLUS VIA INFUSION
6000.0000 [IU] | Freq: Once | INTRAVENOUS | Status: AC
  Administered 2016-05-16: 6000 [IU] via INTRAVENOUS
  Filled 2016-05-16: qty 6000

## 2016-05-16 MED ORDER — ACETAMINOPHEN 325 MG PO TABS
650.0000 mg | ORAL_TABLET | Freq: Four times a day (QID) | ORAL | Status: DC | PRN
Start: 2016-05-16 — End: 2016-05-20
  Administered 2016-05-17: 650 mg via ORAL
  Filled 2016-05-16: qty 2

## 2016-05-16 NOTE — Progress Notes (Signed)
[facility] Progress Note Patient Name: Connor King DOB: [birth year] MRN: [id number]   Date of Service  05/16/2016  HPI/Events of Note  Contacted by ED physician regarding patient presenting with approximately 3 days of dyspnea. Known history of unprovoked DVT previously treated with anticoagulation but currently on none. Patient found to have bilateral pulmonary emboli with RV/LV ratio 1.7. Currently patient is not tachycardic. Saturating normal on room air. Normotensive. No syncopal event. EKG reviewed by me shows no evidence of right heart strain. Cardiac biomarkers pending. Case discussed with ED physician. Heparin has already been ordered for systemic anticoagulation.   eICU Interventions  1. Agree with continuing heparin per pharmacy protocol 2. Await cardiac biomarkers 3. Recommend complete echocardiogram to evaluate RV function 4. Agree with admission to hospitalist service &  PCCM will be available for consultation as needed     Intervention Category Evaluation Type: New Patient Evaluation  Tera Partridge 05/16/2016, 3:26 PM

## 2016-05-16 NOTE — ED Provider Notes (Signed)
[facility] DEPT Provider Note   CSN: [id number] Arrival date & time: 05/16/16  1347     History   Chief Complaint Chief Complaint  Patient presents with  . Shortness of Breath    HPI Connor King is a 65 y.o. male.  HPI   Shortness of breath started slowly on Monday, been constant but significantly worse with exertion.  No chest pain, no fever, no cough, no syncope. Some lightheadedness with exertion and when going from sitting to standing. No leg pain or asymmetric swelling.   DVT 2002, not on blood thinners since, was unprovoked. No recent surgeries, no recent long trips   CT done today showing PE with evidence of right heart strain with at least submassive/intermediate PE  Past Medical History:  Diagnosis Date  . Colon cancer ([place])    cancerous polyp was removed  . DVT (deep venous thrombosis) ([place])   . Hiatal hernia   . Obese   . Sleep apnea     Patient Active Problem List   Diagnosis Date Noted  . History of DVT (deep vein thrombosis) 05/16/2016  . Morbid obesity with BMI of 40.0-44.9, adult ([place]) 05/16/2016  . OSA (obstructive sleep apnea) 05/16/2016  . Acute hyperglycemia 05/16/2016  . Varicose veins 05/16/2016  . Bilateral pulmonary embolism ([place]) 05/16/2016    Past Surgical History:  Procedure Laterality Date  . CHOLECYSTECTOMY  07/25/2012   Procedure: LAPAROSCOPIC CHOLECYSTECTOMY WITH INTRAOPERATIVE CHOLANGIOGRAM;  Surgeon: Edward Jolly, MD;  Location: [facility] ORS;  Service: General;  Laterality: N/A;  . COLON SURGERY    . HERNIA REPAIR    . REPLACEMENT TOTAL KNEE     left knee       Home Medications    Prior to Admission medications   Medication Sig Start Date End Date Taking? Authorizing Provider  aspirin EC 81 MG tablet Take 81 mg by mouth every morning.   Yes Historical Provider, MD  cholecalciferol (VITAMIN D) 1000 units tablet Take 1,000 Units by mouth daily.   Yes Historical Provider, MD    Family History History reviewed.  No pertinent family history.  Social History Social History  Substance Use Topics  . Smoking status: Never Smoker  . Smokeless tobacco: Never Used  . Alcohol use No     Allergies   Review of patient's allergies indicates no known allergies.   Review of Systems Review of Systems  Constitutional: Negative for fever.  HENT: Negative for sore throat.   Eyes: Negative for visual disturbance.  Respiratory: Positive for shortness of breath. Negative for cough.   Cardiovascular: Negative for chest pain. Leg swelling: intermittent unchanged bilateral.  Gastrointestinal: Negative for abdominal pain, nausea and vomiting.  Genitourinary: Negative for difficulty urinating.  Musculoskeletal: Negative for back pain and neck stiffness.  Skin: Negative for rash.  Neurological: Positive for light-headedness. Negative for syncope and headaches.     Physical Exam Updated Vital Signs BP 123/87 (BP Location: Left Arm)   Pulse 77   Temp 98 F (36.7 C) (Oral)   Resp 18   Ht 6\' 2"  (1.88 m)   Wt (!) 330 lb 6.4 oz (149.9 kg)   SpO2 94%   BMI 42.42 kg/m   Physical Exam  Constitutional: He is oriented to person, place, and time. He appears well-developed and well-nourished. No distress.  HENT:  Head: Normocephalic and atraumatic.  Eyes: Conjunctivae and EOM are normal.  Neck: Normal range of motion.  Cardiovascular: Normal rate, regular rhythm, normal heart sounds and intact  distal pulses.  Exam reveals no gallop and no friction rub.   No murmur heard. Pulmonary/Chest: Effort normal and breath sounds normal. No respiratory distress. He has no wheezes. He has no rales.  Abdominal: Soft. He exhibits no distension. There is no tenderness. There is no guarding.  Musculoskeletal: He exhibits no edema.  Neurological: He is alert and oriented to person, place, and time.  Skin: Skin is warm and dry. He is not diaphoretic.  Nursing note and vitals reviewed.    ED Treatments / Results   Labs (all labs ordered are listed, but only abnormal results are displayed) Labs Reviewed  COMPREHENSIVE METABOLIC PANEL - Abnormal; Notable for the following:       Result Value   Glucose, Bld 121 (*)    ALT 14 (*)    All other components within normal limits  BRAIN NATRIURETIC PEPTIDE - Abnormal; Notable for the following:    B Natriuretic Peptide 156.9 (*)    All other components within normal limits  TROPONIN I - Abnormal; Notable for the following:    Troponin I 0.03 (*)    All other components within normal limits  TROPONIN I - Abnormal; Notable for the following:    Troponin I 0.03 (*)    All other components within normal limits  HEMOGLOBIN A1C - Abnormal; Notable for the following:    Hgb A1c MFr Bld 6.8 (*)    All other components within normal limits  BASIC METABOLIC PANEL - Abnormal; Notable for the following:    Glucose, Bld 139 (*)    All other components within normal limits  HEPARIN LEVEL (UNFRACTIONATED) - Abnormal; Notable for the following:    Heparin Unfractionated 0.88 (*)    All other components within normal limits  CBC  PROTIME-INR  APTT  TROPONIN I  TROPONIN I  HEPARIN LEVEL (UNFRACTIONATED)  CBC  CALCIUM  MAGNESIUM  HEPARIN LEVEL (UNFRACTIONATED)    EKG  EKG Interpretation  Date/Time:  Thursday May 16 2016 13:55:20 EDT Ventricular Rate:  81 PR Interval:  288 QRS Duration: 84 QT Interval:  392 QTC Calculation: 455 R Axis:   40 Text Interpretation:  Sinus rhythm with 1st degree A-V block Otherwise normal ECG No significant change since last tracing Confirmed by [facility] MD, Captola Teschner ([id number]) on 05/16/2016 3:01:16 PM       Radiology Ct Angio Chest Pe W Or Wo Contrast  Result Date: 05/16/2016 CLINICAL DATA:  Shortness of breath and dyspnea on exertion for the past 3 days. Fatigue. EXAM: CT ANGIOGRAPHY CHEST WITH CONTRAST TECHNIQUE: Multidetector CT imaging of the chest was performed using the standard protocol during bolus  administration of intravenous contrast. Multiplanar CT image reconstructions and MIPs were obtained to evaluate the vascular anatomy. CONTRAST:  75 cc Isovue 370 COMPARISON:  None. FINDINGS: Cardiovascular: Multiple bilateral pulmonary arterial filling defects. These include moderate-large si central pulmonary artery filling defects, greater on the right. Normal sized heart. The right ventricular to left ventricular ratio is 1.7. Mediastinum/Nodes: No mediastinal mass or enlarged lymph nodes. Lungs/Pleura: Lungs are clear. No pleural effusion or pneumothorax. Upper Abdomen: Cholecystectomy clips.  7 mm left renal calculus. Musculoskeletal: Thoracic spine degenerative changes. Bilateral AC joint degenerative changes. Review of the MIP images confirms the above findings. IMPRESSION: 1. Positive for acute PE with CT evidence of right heart strain (RV/LV Ratio = 1.7) consistent with at least submassive (intermediate risk) PE. The presence of right heart strain has been associated with an increased risk of morbidity and mortality.  Please activate Code PE by paging [phone]. 2. 7 mm nonobstructing left renal calculus. Critical Value/emergent results were called by telephone at the time of interpretation on 05/16/2016 at 1:13 pm to Dr. Lavone Orn , who verbally acknowledged these results. Electronically Signed   By: Claudie Revering M.D.   On: 05/16/2016 13:17    Procedures Procedures (including critical care time)  Medications Ordered in ED Medications  heparin ADULT infusion 100 units/mL (25000 units/238mL sodium chloride 0.45%) (1,950 Units/hr Intravenous Rate/Dose Change 05/17/16 1208)  acetaminophen (TYLENOL) tablet 650 mg (650 mg Oral Given 05/17/16 1214)    Or  acetaminophen (TYLENOL) suppository 650 mg ( Rectal See Alternative 05/17/16 1214)  heparin bolus via infusion 6,000 Units (6,000 Units Intravenous Bolus from Bag 05/16/16 1600)     Initial Impression / Assessment and Plan / ED Course  I have  reviewed the triage vital signs and the nursing notes.  Pertinent labs & imaging results that were available during my care of the patient were reviewed by me and considered in my medical decision making (see chart for details).  CRITICAL CARE Performed by: Alvino Chapel   Total critical care time: 30 minutes  Critical care time was exclusive of separately billable procedures and treating other patients.  Critical care was necessary to treat or prevent imminent or life-threatening deterioration.  Critical care was time spent personally by me on the following activities: development of treatment plan with patient and/or surrogate as well as nursing, discussions with consultants, evaluation of patient's response to treatment, examination of patient, obtaining history from patient or surrogate, ordering and performing treatments and interventions, ordering and review of laboratory studies, ordering and review of radiographic studies, pulse oximetry and re-evaluation of patient's condition.   Clinical Course   65 year old male with a remote history of unprovoked DVT in 2002, remote history of a cancerous colonic polyp, however no history of known cancer, no recent travel, no recent surgeries, who presents to 2 PCP with 3 days of shortness of breath, and had an outpatient CT scan which showed pulmonary embolus with CT findings concerning for right heart strain. On arrival to the emergency department, patient has normal vital signs, normal blood pressures, not tachypnea, not hypoxic on room air.  Did not call Code PE, however discussed patient and CT findings with pulmonology critical care on patient arrival and discussed heparin order with pharmacy.    Per critical care, agree with starting heparin drip, admitting to hospital service, and obtaining a formal consult if patient has significantly elevated troponin or other clinical changes.   Final Clinical Impressions(s) / ED Diagnoses    Final diagnoses:  Other acute pulmonary embolism without acute cor pulmonale [place])    New Prescriptions Current Discharge Medication List       Gareth Morgan, MD 05/17/16 1257

## 2016-05-16 NOTE — H&P (Signed)
History and Physical    Connor King [id number] DOB: June 01, 1951 DOA: 05/16/2016   PCP: Irven Shelling, MD   Patient coming from/Resides with: Private residence/lives with wife  Admission status: Inpatient/telemetry--medically necessary to stay a minimum 2 midnights to rule out impending and/or unexpected changes in physiologic status that may differ from initial evaluation performed in the ER and/or at time of admission. Presents with bilateral pulmonary embolism with CT evidence of right heart strain requiring continuous IV heparin infusion and further evaluation and monitoring to ensure hemodynamic stability  Chief Complaint: Shortness of breath  HPI: Connor King is a 65 y.o. male with medical history significant for unprovoked DVT in 2002, obesity, sleep apnea on CPAP. Patient presented to his primary care physician's office today after noticing dyspnea on exertion beginning on Monday. He was working out in his yard when the symptoms began. Symptoms persisted. He is not had any cough fevers chills or other constitutional symptoms suggestive of pneumonia or bronchitis. He has not taken any long trips and has not been hospitalized undergone any surgeries recently. His PCP sent him for CT of the chest which revealed bilateral pulmonary embolism with CT evidence of right heart strain.   ED Course:  Vital Signs: BP 123/74   Pulse 69   Temp 98.9 F (37.2 C) (Oral)   Resp 14   Ht 6\' 2"  (1.88 m)   Wt (!) 152 kg (335 lb)   SpO2 93%   BMI 43.01 kg/m  CT angiography of the chest PE protocol with and without contrast: Positive for acute PE with CT evidence of right heart strain; a 7 mm nonobstructing left renal calculus Lab data: Sodium 140, potassium 4.2, CO2 29, BUN 17, creatinine 1.11, glucose 121, LFTs normal, BNP 157, troponin 0.03, W BCs 9200 differential not obtained, hemoglobin 14.8, platelets 171,000, PT 14.1, INR 1.09, PTT 32 Medications and treatments: Heparin bolus 6000  units followed by an infusion of 2100 units per hour  Review of Systems:  In addition to the HPI above,  No Fever-chills, myalgias or other constitutional symptoms No Headache, changes with Vision or hearing, new weakness, tingling, numbness in any extremity, No problems swallowing food or Liquids, indigestion/reflux No Chest pain, Cough, palpitations, orthopnea No Abdominal pain, N/V; no melena or hematochezia, no dark tarry stools No dysuria, hematuria or flank pain No new skin rashes, lesions, masses or bruises, No new joints pains-aches No recent weight gain or loss No polyuria, polydypsia or polyphagia,   Past Medical History:  Diagnosis Date  . Colon cancer ([place])    cancerous polyp was removed  . DVT (deep venous thrombosis) ([place])   . Hiatal hernia   . Obese   . Sleep apnea     Past Surgical History:  Procedure Laterality Date  . CHOLECYSTECTOMY  07/25/2012   Procedure: LAPAROSCOPIC CHOLECYSTECTOMY WITH INTRAOPERATIVE CHOLANGIOGRAM;  Surgeon: Edward Jolly, MD;  Location: [facility] ORS;  Service: General;  Laterality: N/A;  . COLON SURGERY    . HERNIA REPAIR    . REPLACEMENT TOTAL KNEE     left knee    Social History   Social History  . Marital status: Married    Spouse name: N/A  . Number of children: N/A  . Years of education: N/A   Occupational History  . Not on file.   Social History Main Topics  . Smoking status: Never Smoker  . Smokeless tobacco: Never Used  . Alcohol use No  . Drug use: No  .  Sexual activity: Not Currently   Other Topics Concern  . Not on file   Social History Narrative  . 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 Family history reviewed and not pertinent to current admission diagnosis  Prior to Admission medications   Medication Sig Start Date End Date Taking? Authorizing Provider  amoxicillin-clavulanate (AUGMENTIN) 875-125 MG per  tablet Take 1 tablet by mouth 2 (two) times daily. 07/27/12   Real Cons White, PA-C  oxyCODONE-acetaminophen (PERCOCET/ROXICET) 5-325 MG per tablet Take 1-2 tablets by mouth every 6 (six) hours as needed. 07/27/12   Iona Hansen, PA-C    Physical Exam: Vitals:   05/16/16 1406 05/16/16 1509 05/16/16 1530 05/16/16 1545  BP: 119/71 132/86 126/79 123/74  Pulse: 81 80 76 69  Resp: 18 25 21 14   Temp: 98.9 F (37.2 C)     TempSrc: Oral     SpO2: 96% 95% 92% 93%  Weight:      Height:          Constitutional: NAD, calm, comfortable Eyes: PERRL, lids and conjunctivae normal ENMT: Mucous membranes are moist. Posterior pharynx clear of any exudate or lesions.Normal dentition.  Neck: normal, supple, no masses, no thyromegaly Respiratory: clear to auscultation bilaterally, no wheezing, no crackles. Normal respiratory effort. No accessory muscle use.  Cardiovascular: Regular rate and rhythm, no murmurs / rubs / gallops. No extremity edema. 2+ pedal pulses. No carotid bruits.  Abdomen: no tenderness, no masses palpated. No hepatosplenomegaly. Bowel sounds positive.  Musculoskeletal: no clubbing / cyanosis. No joint deformity upper and lower extremities. Good ROM, no contractures. Normal muscle tone.  Skin: no rashes, lesions, ulcers. No induration Neurologic: CN 2-12 grossly intact. Sensation intact, DTR normal. Strength 5/5 x all 4 extremities.  Psychiatric: Normal judgment and insight. Alert and oriented x 3. Normal mood.    Labs on Admission: I have personally reviewed following labs and imaging studies  CBC:  Recent Labs Lab 05/16/16 1355  WBC 9.2  HGB 14.8  HCT 44.8  MCV 94.5  PLT [id number]   Basic Metabolic Panel:  Recent Labs Lab 05/16/16 1355  NA 140  K 4.2  CL 103  CO2 29  GLUCOSE 121*  BUN 17  CREATININE 1.11  CALCIUM 9.5   GFR: Estimated Creatinine Clearance: 103.3 mL/min (by C-G formula based on SCr of 1.11 mg/dL). Liver Function Tests:  Recent Labs Lab  05/16/16 1355  AST 20  ALT 14*  ALKPHOS 75  BILITOT 0.9  PROT 7.1  ALBUMIN 4.0   No results for input(s): LIPASE, AMYLASE in the last 168 hours. No results for input(s): AMMONIA in the last 168 hours. Coagulation Profile:  Recent Labs Lab 05/16/16 1355  INR 1.09   Cardiac Enzymes:  Recent Labs Lab 05/16/16 1355  TROPONINI 0.03*   BNP (last 3 results) No results for input(s): PROBNP in the last 8760 hours. HbA1C: No results for input(s): HGBA1C in the last 72 hours. CBG: No results for input(s): GLUCAP in the last 168 hours. Lipid Profile: No results for input(s): CHOL, HDL, LDLCALC, TRIG, CHOLHDL, LDLDIRECT in the last 72 hours. Thyroid Function Tests: No results for input(s): TSH, T4TOTAL, FREET4, T3FREE, THYROIDAB in the last 72 hours. Anemia Panel: No results for input(s): VITAMINB12, FOLATE, FERRITIN, TIBC, IRON, RETICCTPCT in the last 72 hours. Urine analysis:    Component Value Date/Time   COLORURINE YELLOW 08/03/2009 [place]  08/03/2009 1252   LABSPEC 1.019 08/03/2009 1252   PHURINE 5.0 08/03/2009 1252   GLUCOSEU NEGATIVE 08/03/2009 1252   HGBUR NEGATIVE 08/03/2009 [place] 08/03/2009 1252   KETONESUR NEGATIVE 08/03/2009 1252   PROTEINUR NEGATIVE 08/03/2009 1252   UROBILINOGEN 0.2 08/03/2009 1252   NITRITE NEGATIVE 08/03/2009 1252   LEUKOCYTESUR  08/03/2009 1252    NEGATIVE MICROSCOPIC NOT DONE ON URINES WITH NEGATIVE PROTEIN, BLOOD, LEUKOCYTES, NITRITE, OR GLUCOSE <1000 mg/dL.   Sepsis Labs: @LABRCNTIP (procalcitonin:4,lacticidven:4) )No results found for this or any previous visit (from the past 240 hour(s)).   Radiological Exams on Admission: Ct Angio Chest Pe W Or Wo Contrast  Result Date: 05/16/2016 CLINICAL DATA:  Shortness of breath and dyspnea on exertion for the past 3 days. Fatigue. EXAM: CT ANGIOGRAPHY CHEST WITH CONTRAST TECHNIQUE: Multidetector CT imaging of the chest was performed using the standard  protocol during bolus administration of intravenous contrast. Multiplanar CT image reconstructions and MIPs were obtained to evaluate the vascular anatomy. CONTRAST:  75 cc Isovue 370 COMPARISON:  None. FINDINGS: Cardiovascular: Multiple bilateral pulmonary arterial filling defects. These include moderate-large si central pulmonary artery filling defects, greater on the right. Normal sized heart. The right ventricular to left ventricular ratio is 1.7. Mediastinum/Nodes: No mediastinal mass or enlarged lymph nodes. Lungs/Pleura: Lungs are clear. No pleural effusion or pneumothorax. Upper Abdomen: Cholecystectomy clips.  7 mm left renal calculus. Musculoskeletal: Thoracic spine degenerative changes. Bilateral AC joint degenerative changes. Review of the MIP images confirms the above findings. IMPRESSION: 1. Positive for acute PE with CT evidence of right heart strain (RV/LV Ratio = 1.7) consistent with at least submassive (intermediate risk) PE. The presence of right heart strain has been associated with an increased risk of morbidity and mortality. Please activate Code PE by paging [phone]. 2. 7 mm nonobstructing left renal calculus. Critical Value/emergent results were called by telephone at the time of interpretation on 05/16/2016 at 1:13 pm to Dr. Lavone Orn , who verbally acknowledged these results. Electronically Signed   By: Claudie Revering M.D.   On: 05/16/2016 13:17    EKG: (Independently reviewed) sinus rhythm with slightly prolonged. Our interval consistent with first-degree A-V block, QTC 455 ms, ventricular rate 81 bpm, no ST segment changes or T-wave changes concerning for ischemia, no ECG evidence of right heart strain noted in the EKG is similar to previous EKG from 2013  Assessment/Plan Principal Problem:   Bilateral pulmonary embolism/  History of DVT (deep vein thrombosis)/  Varicose veins -Patient presents with shortness of breath and was found to have bilateral pulmonary embolism and  has history of unprovoked DVT in 2002 which was his initial occurrence and was only on anticoagulation short-term and did not undergo coagulopathy workup to the best of his knowledge -Possible right heart strain on CT scan; Dr. Ashok Cordia with pulmonary medicine was contacted by the ER physician (per EDP sign out to me)-chart and diagnostics reviewed and he recommended continuing IV heparin, following troponin and obtaining echo and appropriate at this juncture to admit to telemetry bed -Continue IV heparin infusion with likely transition to oral agent within the next 24-48 hours -I have asked case management to check on co-pay for all 3 NOACs -Not hypoxic at rest, prn oxygen available -Patient does not carry a formal diagnosis of varicosities but on clinical exam he does have evidence of bilateral lower extremity varicosities and this may be the etiology to patient's recurrent thrombotic events -Cycle troponin -Echo  Active Problems:  Acute hyperglycemia -HgbA1c    Morbid obesity with BMI of 40.0-44.9, adult /OSA (obstructive sleep apnea) -Continue hour of sleep CPAP-patient's family to bring machine and mask from home      DVT prophylaxis: IV heparin infusion Code Status: Full Family Communication: Wife at bedside Disposition Plan: Anticipate discharge back to preadmission home environment once medically stable Consults called: EDP consulted Dr Ashok Cordia with critical care medicine with recommendations as outlined above     Jaela Yepez L. ANP-BC [facility] Pager [phone]   If 7PM-7AM, please contact night-coverage [email or web address] Password [facility]  05/16/2016, 4:13 PM

## 2016-05-16 NOTE — ED Triage Notes (Signed)
Pt came to doctors office. Blood was drawn. Xray was done. Pt states: " they found blood clots in my lungs". Came straight here.

## 2016-05-16 NOTE — Progress Notes (Signed)
Pt. Has home cpap. RT informed pt. To notify if he needed anything.

## 2016-05-16 NOTE — Progress Notes (Signed)
ANTICOAGULATION CONSULT NOTE - Initial Consult  Pharmacy Consult for heparin  Indication: pulmonary embolus  No Known Allergies  Patient Measurements: Height: 6\' 2"  (188 cm) Weight: (!) 335 lb (152 kg) IBW/kg (Calculated) : 82.2 Heparin Dosing Weight: 117 kg   Vital Signs: Temp: 98.9 F (37.2 C) (09/14 1406) Temp Source: Oral (09/14 1406) BP: 132/86 (09/14 1509) Pulse Rate: 80 (09/14 1509)  Labs:  Recent Labs  05/16/16 1355  HGB 14.8  HCT 44.8  PLT 171  CREATININE 1.11    Estimated Creatinine Clearance: 103.3 mL/min (by C-G formula based on SCr of 1.11 mg/dL).   Medical History: Past Medical History:  Diagnosis Date  . Colon cancer ([place])    cancerous polyp was removed  . DVT (deep venous thrombosis) ([place])   . Hiatal hernia   . Obese   . Sleep apnea    Assessment: 65 yo admitted with dyspnea. History of DVT in 2002. Not on oral anticoagulation at this time. CT report suggestive of PE with right heart strain. CBC stable. No overt signs or symptoms of bleeding.   Goal of Therapy:  Heparin level 0.3-0.7 units/ml Monitor platelets by anticoagulation protocol: Yes   Plan:  Heparin 6000 units once Heparin gtt at 2100 units/hr  Heparin level in 6 hours Daily heparin level and CBC Monitor for s/s bleeding   Argie Ramming, PharmD Pharmacy Resident  Pager [phone] 05/16/16 3:49 PM

## 2016-05-16 NOTE — ED Notes (Signed)
Ordered diet tray 

## 2016-05-17 DIAGNOSIS — I2699 Other pulmonary embolism without acute cor pulmonale: Principal | ICD-10-CM

## 2016-05-17 LAB — BASIC METABOLIC PANEL
ANION GAP: 7 (ref 5–15)
BUN: 15 mg/dL (ref 6–20)
CO2: 28 mmol/L (ref 22–32)
Calcium: 9.6 mg/dL (ref 8.9–10.3)
Chloride: 104 mmol/L (ref 101–111)
Creatinine, Ser: 1.08 mg/dL (ref 0.61–1.24)
GLUCOSE: 139 mg/dL — AB (ref 65–99)
POTASSIUM: 4.9 mmol/L (ref 3.5–5.1)
Sodium: 139 mmol/L (ref 135–145)

## 2016-05-17 LAB — CBC
HCT: 45.4 % (ref 39.0–52.0)
Hemoglobin: 14.9 g/dL (ref 13.0–17.0)
MCH: 31.1 pg (ref 26.0–34.0)
MCHC: 32.8 g/dL (ref 30.0–36.0)
MCV: 94.8 fL (ref 78.0–100.0)
PLATELETS: 185 10*3/uL (ref 150–400)
RBC: 4.79 MIL/uL (ref 4.22–5.81)
RDW: 13.9 % (ref 11.5–15.5)
WBC: 8.9 10*3/uL (ref 4.0–10.5)

## 2016-05-17 LAB — HEPARIN LEVEL (UNFRACTIONATED)
HEPARIN UNFRACTIONATED: 0.42 [IU]/mL (ref 0.30–0.70)
Heparin Unfractionated: 0.4 IU/mL (ref 0.30–0.70)
Heparin Unfractionated: 0.88 IU/mL — ABNORMAL HIGH (ref 0.30–0.70)

## 2016-05-17 LAB — CALCIUM: CALCIUM: 8.9 mg/dL (ref 8.9–10.3)

## 2016-05-17 LAB — HEMOGLOBIN A1C
HEMOGLOBIN A1C: 6.8 % — AB (ref 4.8–5.6)
Mean Plasma Glucose: 148 mg/dL

## 2016-05-17 LAB — MAGNESIUM: MAGNESIUM: 1.9 mg/dL (ref 1.7–2.4)

## 2016-05-17 LAB — TROPONIN I
Troponin I: <0.03 ng/mL (ref <0.03)
Troponin I: <0.03 ng/mL (ref <0.03)

## 2016-05-17 NOTE — Progress Notes (Signed)
Insurance check completed for Eliquis vs Xarelto vs PradaxaJamelle Haring [facility] @ [employer] # [phone]   XARELTO 20 MG DAILY ( 30 ) 30 TAB  COVER- YES  CO-PAY- $ 40.00  TIER- 2 DRUG  PRIOR APPROVAL- YES # [id number]   ELIQUIS 5 MG BID  ( 30 ) 60 TAB  COVER- YES  CO-PAY- $ 40.00  TIER- 2 DRUG  PRIOR APPROVAL- YES # [id number]   PRADAXA 150 MG BID ( 30 ) 60 TAB  COVER- YES  CO-PAY- $64.00  TIER- 3 DRUG  PRIOR APPROVAL - YES # [id number]   PHARMACY : [facility], [address]

## 2016-05-17 NOTE — Progress Notes (Signed)
ANTICOAGULATION CONSULT NOTE - Follow Up Consult  Pharmacy Consult for Heparin Indication: pulmonary embolus  No Known Allergies  Patient Measurements: Height: 6\' 2"  (188 cm) Weight: (!) 330 lb 6.4 oz (149.9 kg) IBW/kg (Calculated) : 82.2 Heparin Dosing Weight: 117 kg  Vital Signs: Temp: 98.2 F (36.8 C) (09/15 1336) Temp Source: Oral (09/15 1336) BP: 124/80 (09/15 1336) Pulse Rate: 71 (09/15 1336)  Labs:  Recent Labs  05/16/16 1355 05/16/16 2320 05/16/16 2339 05/17/16 0352 05/17/16 0952 05/17/16 1834  HGB 14.8  --   --  14.9  --   --   HCT 44.8  --   --  45.4  --   --   PLT 171  --   --  185  --   --   APTT 32  --   --   --   --   --   LABPROT 14.1  --   --   --   --   --   INR 1.09  --   --   --   --   --   HEPARINUNFRC  --   --  0.40  --  0.88* 0.42  CREATININE 1.11  --   --  1.08  --   --   TROPONINI 0.03*  0.03* <0.03  --  <0.03  --   --     Estimated Creatinine Clearance: 105.4 mL/min (by C-G formula based on SCr of 1.08 mg/dL).  Assessment: 65 yr old male admitted last night with dyspnea. Found to have bilateral PE with right heart strain.  Hx DVT in 2002.  PM Heparin level = 0.42    Goal of Therapy:  Heparin level 0.3-0.7 units/ml Monitor platelets by anticoagulation protocol: Yes   Plan:  Continue heparin at 1950 units / hr Follow up AM labs  Thank you Anette Guarneri, PharmD [phone]  05/17/2016,7:45 PM

## 2016-05-17 NOTE — Progress Notes (Signed)
PROGRESS NOTE    Connor King  [id number] DOB: 1950/09/18 DOA: 05/16/2016 PCP: Irven Shelling, MD   Brief Narrative: HPI: Connor King is a 65 y.o. male with medical history significant for unprovoked DVT in 2002, obesity, sleep apnea on CPAP. Patient presented to his primary care physician's office today after noticing dyspnea on exertion beginning on Monday. He was working out in his yard when the symptoms began. Symptoms persisted. He is not had any cough fevers chills or other constitutional symptoms suggestive of pneumonia or bronchitis. He has not taken any long trips and has not been hospitalized undergone any surgeries recently. His PCP sent him for CT of the chest which revealed bilateral pulmonary embolism with CT evidence of right heart strain.     Assessment & Plan:   Principal Problem:   Bilateral pulmonary embolism (HCC) Active Problems:   History of DVT (deep vein thrombosis)   Morbid obesity with BMI of 40.0-44.9, adult (HCC)   OSA (obstructive sleep apnea)   Acute hyperglycemia   Varicose veins   Bilateral pulmonary embolism, with right heart strain by CT scan.  Continue with heparin for at least 48 more hours.  Follow ECHO.  Vitals stable at this time.  Check Doppler LE.  Needs lifelong anticoagulation.  I have discussed oral options for anticoagulation with patient. Xarelto vs coumadin.     Acute hyperglycemia -History of pre-diabetes.  -HgbA1c 6.8. Will need metformin at discharge.   Morbid obesity with BMI of 40.0-44.9, adult /OSA (obstructive sleep apnea) -Continue hour of sleep CPAP-patient's family to bring machine and mask from home    DVT prophylaxis: Heparin  Code Status: Full code.  Family Communication: care discussed with wife who was at bedside.  Disposition Plan: home in 72 hours, continue with heparin Gtt   Consultants:  none   Procedures:   ECHO; Pending  Doppler Pending.    Antimicrobials:   none     Subjective: He is feeling better, dyspnea improved.  Denies chest pain.    Objective: Vitals:   05/16/16 1809 05/16/16 2227 05/17/16 0400 05/17/16 1336  BP: 125/76 130/72 123/87 124/80  Pulse: 76 76 77 71  Resp:  18 18 18   Temp: 98.1 F (36.7 C) 98.4 F (36.9 C) 98 F (36.7 C) 98.2 F (36.8 C)  TempSrc: Oral Oral Oral Oral  SpO2: 98% 96% 94% 97%  Weight: (!) 149.9 kg (330 lb 6.4 oz)     Height: 6\' 2"  (1.88 m)       Intake/Output Summary (Last 24 hours) at 05/17/16 1409 Last data filed at 05/17/16 1300  Gross per 24 hour  Intake              960 ml  Output                0 ml  Net              960 ml   Filed Weights   05/16/16 1354 05/16/16 1809  Weight: (!) 152 kg (335 lb) (!) 149.9 kg (330 lb 6.4 oz)    Examination:  General exam: Appears calm and comfortable  Respiratory system: Clear to auscultation. Respiratory effort normal. Cardiovascular system: S1 & S2 heard, RRR. No JVD, murmurs, rubs, gallops or clicks. No pedal edema. Gastrointestinal system: Abdomen is nondistended, soft and nontender. No organomegaly or masses felt. Normal bowel sounds heard. Central nervous system: Alert and oriented. No focal neurological deficits. Extremities: Symmetric 5 x 5 power. Skin: No  rashes, lesions or ulcers Psychiatry: Judgement and insight appear normal. Mood & affect appropriate.     Data Reviewed: I have personally reviewed following labs and imaging studies  CBC:  Recent Labs Lab 05/16/16 1355 05/17/16 0352  WBC 9.2 8.9  HGB 14.8 14.9  HCT 44.8 45.4  MCV 94.5 94.8  PLT 171 [id number]   Basic Metabolic Panel:  Recent Labs Lab 05/16/16 1355 05/16/16 2320 05/17/16 0352  NA 140  --  139  K 4.2  --  4.9  CL 103  --  104  CO2 29  --  28  GLUCOSE 121*  --  139*  BUN 17  --  15  CREATININE 1.11  --  1.08  CALCIUM 9.5 8.9 9.6  MG  --  1.9  --    GFR: Estimated Creatinine Clearance: 105.4 mL/min (by C-G formula based on SCr of 1.08 mg/dL). Liver  Function Tests:  Recent Labs Lab 05/16/16 1355  AST 20  ALT 14*  ALKPHOS 75  BILITOT 0.9  PROT 7.1  ALBUMIN 4.0   No results for input(s): LIPASE, AMYLASE in the last 168 hours. No results for input(s): AMMONIA in the last 168 hours. Coagulation Profile:  Recent Labs Lab 05/16/16 1355  INR 1.09   Cardiac Enzymes:  Recent Labs Lab 05/16/16 1355 05/16/16 2320 05/17/16 0352  TROPONINI 0.03*  0.03* <0.03 <0.03   BNP (last 3 results) No results for input(s): PROBNP in the last 8760 hours. HbA1C:  Recent Labs  05/16/16 1600  HGBA1C 6.8*   CBG: No results for input(s): GLUCAP in the last 168 hours. Lipid Profile: No results for input(s): CHOL, HDL, LDLCALC, TRIG, CHOLHDL, LDLDIRECT in the last 72 hours. Thyroid Function Tests: No results for input(s): TSH, T4TOTAL, FREET4, T3FREE, THYROIDAB in the last 72 hours. Anemia Panel: No results for input(s): VITAMINB12, FOLATE, FERRITIN, TIBC, IRON, RETICCTPCT in the last 72 hours. Sepsis Labs: No results for input(s): PROCALCITON, LATICACIDVEN in the last 168 hours.  No results found for this or any previous visit (from the past 240 hour(s)).       Radiology Studies: Ct Angio Chest Pe W Or Wo Contrast  Result Date: 05/16/2016 CLINICAL DATA:  Shortness of breath and dyspnea on exertion for the past 3 days. Fatigue. EXAM: CT ANGIOGRAPHY CHEST WITH CONTRAST TECHNIQUE: Multidetector CT imaging of the chest was performed using the standard protocol during bolus administration of intravenous contrast. Multiplanar CT image reconstructions and MIPs were obtained to evaluate the vascular anatomy. CONTRAST:  75 cc Isovue 370 COMPARISON:  None. FINDINGS: Cardiovascular: Multiple bilateral pulmonary arterial filling defects. These include moderate-large si central pulmonary artery filling defects, greater on the right. Normal sized heart. The right ventricular to left ventricular ratio is 1.7. Mediastinum/Nodes: No mediastinal mass  or enlarged lymph nodes. Lungs/Pleura: Lungs are clear. No pleural effusion or pneumothorax. Upper Abdomen: Cholecystectomy clips.  7 mm left renal calculus. Musculoskeletal: Thoracic spine degenerative changes. Bilateral AC joint degenerative changes. Review of the MIP images confirms the above findings. IMPRESSION: 1. Positive for acute PE with CT evidence of right heart strain (RV/LV Ratio = 1.7) consistent with at least submassive (intermediate risk) PE. The presence of right heart strain has been associated with an increased risk of morbidity and mortality. Please activate Code PE by paging [phone]. 2. 7 mm nonobstructing left renal calculus. Critical Value/emergent results were called by telephone at the time of interpretation on 05/16/2016 at 1:13 pm to Dr. Lavone Orn , who verbally acknowledged these  results. Electronically Signed   By: Claudie Revering M.D.   On: 05/16/2016 13:17        Scheduled Meds:  Continuous Infusions: . heparin 1,950 Units/hr (05/17/16 1208)     LOS: 1 day    Time spent: 35 minutes.     Elmarie Shiley, MD [facility] Pager [phone]  If 7PM-7AM, please contact night-coverage [email or web address] Password [email or web address] 05/17/2016, 2:09 PM

## 2016-05-17 NOTE — Progress Notes (Signed)
Placed patient on home CPAP set a 14cm

## 2016-05-17 NOTE — Progress Notes (Signed)
ANTICOAGULATION CONSULT NOTE - Follow Up Consult  Pharmacy Consult for Heparin Indication: pulmonary embolus  No Known Allergies  Patient Measurements: Height: 6\' 2"  (188 cm) Weight: (!) 330 lb 6.4 oz (149.9 kg) IBW/kg (Calculated) : 82.2 Heparin Dosing Weight: 117 kg  Vital Signs: Temp: 98 F (36.7 C) (09/15 0400) Temp Source: Oral (09/15 0400) BP: 123/87 (09/15 0400) Pulse Rate: 77 (09/15 0400)  Labs:  Recent Labs  05/16/16 1355 05/16/16 2320 05/16/16 2339 05/17/16 0352 05/17/16 0952  HGB 14.8  --   --  14.9  --   HCT 44.8  --   --  45.4  --   PLT 171  --   --  185  --   APTT 32  --   --   --   --   LABPROT 14.1  --   --   --   --   INR 1.09  --   --   --   --   HEPARINUNFRC  --   --  0.40  --  0.88*  CREATININE 1.11  --   --  1.08  --   TROPONINI 0.03*  0.03* <0.03  --  <0.03  --     Estimated Creatinine Clearance: 105.4 mL/min (by C-G formula based on SCr of 1.08 mg/dL).  Assessment:   65 yr old male admitted last night with dyspnea. Found to have bilateral PE with right heart strain.  Hx DVT in 2002.    Heparin drip begun at 4pm on 9/14. Initial heparin level was 0.40 on 2100 units/hr, but subsequent level up to 0.88 (supratherapeutic) on same rate.  Sample drawn from opposite arm.  Goal of Therapy:  Heparin level 0.3-0.7 units/ml Monitor platelets by anticoagulation protocol: Yes   Plan:   Decrease heparin drip to 1950 units/hr  Heparin level ~6hrs after rate change.  Daily heparin level and CBC while on heparin.  Will follow up for oral anticoagulation plans.  Arty Baumgartner, [place] Pager: [phone] 05/17/2016,11:11 AM

## 2016-05-17 NOTE — Progress Notes (Signed)
Connor King for heparin  Indication: pulmonary embolus  No Known Allergies  Patient Measurements: Height: 6\' 2"  (188 cm) Weight: (!) 330 lb 6.4 oz (149.9 kg) IBW/kg (Calculated) : 82.2 Heparin Dosing Weight: 117 kg   Vital Signs: Temp: 98.4 F (36.9 C) (09/14 2227) Temp Source: Oral (09/14 2227) BP: 130/72 (09/14 2227) Pulse Rate: 76 (09/14 2227)  Labs:  Recent Labs  05/16/16 1355 05/16/16 2320 05/16/16 2339  HGB 14.8  --   --   HCT 44.8  --   --   PLT 171  --   --   APTT 32  --   --   LABPROT 14.1  --   --   INR 1.09  --   --   HEPARINUNFRC  --   --  0.40  CREATININE 1.11  --   --   TROPONINI 0.03*  0.03* <0.03  --     Estimated Creatinine Clearance: 102.6 mL/min (by C-G formula based on SCr of 1.11 mg/dL).   Medical History: Past Medical History:  Diagnosis Date  . Colon cancer ([place])    cancerous polyp was removed  . DVT (deep venous thrombosis) ([place])   . Hiatal hernia   . Obese   . Sleep apnea    Assessment: 65 yo admitted with dyspnea. History of DVT in 2002. Not on oral anticoagulation at this time. CT report suggestive of PE with right heart strain. CBC stable. No overt signs or symptoms of bleeding.   Initial heparin level is therapeutic.  Goal of Therapy:  Heparin level 0.3-0.7 units/ml Monitor platelets by anticoagulation protocol: Yes   Plan:  -Continue heparin at 2100 units/hr -Daily HL, CBC -Confirmatory level in AM   Hughes Better, PharmD, BCPS Clinical Pharmacist 05/17/2016 1:06 AM

## 2016-05-18 ENCOUNTER — Inpatient Hospital Stay (HOSPITAL_COMMUNITY): Payer: Medicare Other

## 2016-05-18 DIAGNOSIS — I2699 Other pulmonary embolism without acute cor pulmonale: Secondary | ICD-10-CM

## 2016-05-18 DIAGNOSIS — Z86718 Personal history of other venous thrombosis and embolism: Secondary | ICD-10-CM

## 2016-05-18 LAB — CBC
HCT: 40.5 % (ref 39.0–52.0)
HEMOGLOBIN: 13.3 g/dL (ref 13.0–17.0)
MCH: 31.1 pg (ref 26.0–34.0)
MCHC: 32.8 g/dL (ref 30.0–36.0)
MCV: 94.8 fL (ref 78.0–100.0)
Platelets: 170 10*3/uL (ref 150–400)
RBC: 4.27 MIL/uL (ref 4.22–5.81)
RDW: 14 % (ref 11.5–15.5)
WBC: 7.1 10*3/uL (ref 4.0–10.5)

## 2016-05-18 LAB — HEPARIN LEVEL (UNFRACTIONATED): Heparin Unfractionated: 0.6 IU/mL (ref 0.30–0.70)

## 2016-05-18 MED ORDER — ALUM & MAG HYDROXIDE-SIMETH 200-200-20 MG/5ML PO SUSP
30.0000 mL | ORAL | Status: DC | PRN
  Administered 2016-05-18: 30 mL via ORAL
  Filled 2016-05-18 (×2): qty 30

## 2016-05-18 NOTE — Progress Notes (Signed)
ANTICOAGULATION CONSULT NOTE - Follow Up Consult  Pharmacy Consult for Heparin Indication: pulmonary embolus  No Known Allergies  Patient Measurements: Height: 6\' 2"  (188 cm) Weight: (!) 330 lb 6.4 oz (149.9 kg) IBW/kg (Calculated) : 82.2 Heparin Dosing Weight: 117 kg  Vital Signs: Temp: 97.8 F (36.6 C) (09/16 0510) Temp Source: Axillary (09/16 0510) BP: 111/63 (09/16 0510) Pulse Rate: 76 (09/16 0510)  Labs:  Recent Labs  05/16/16 1355 05/16/16 2320  05/17/16 0352 05/17/16 0952 05/17/16 1834 05/18/16 0318  HGB 14.8  --   --  14.9  --   --  13.3  HCT 44.8  --   --  45.4  --   --  40.5  PLT 171  --   --  185  --   --  170  APTT 32  --   --   --   --   --   --   LABPROT 14.1  --   --   --   --   --   --   INR 1.09  --   --   --   --   --   --   HEPARINUNFRC  --   --   < >  --  0.88* 0.42 0.60  CREATININE 1.11  --   --  1.08  --   --   --   TROPONINI 0.03*  0.03* <0.03  --  <0.03  --   --   --   < > = values in this interval not displayed.  Estimated Creatinine Clearance: 105.4 mL/min (by C-G formula based on SCr of 1.08 mg/dL).   . heparin 1,950 Units/hr (05/18/16 0134)     Assessment: 65 yr old male admitted last night with dyspnea. Found to have bilateral PE with right heart strain.  Hx DVT in 2002.  Heparin level therapeutic on heparin 1950 units/hr.  No bleeding or complications noted, CBC stable.  Per MD note, patient prefers Coumadin at discharge.  Goal of Therapy:  Heparin level 0.3-0.7 units/ml Monitor platelets by anticoagulation protocol: Yes   Plan:  Continue heparin at 1950 units / hr Daily heparin level and CBC. Start Coumadin soon if that is plan?  Uvaldo Rising, BCPS  Clinical Pharmacist Pager [phone]  05/18/2016 1:46 PM

## 2016-05-18 NOTE — Progress Notes (Signed)
*  PRELIMINARY RESULTS* Echocardiogram 2D Echocardiogram has been performed.  Connor King 05/18/2016, 9:41 AM

## 2016-05-18 NOTE — Progress Notes (Signed)
PROGRESS NOTE    Connor King  [id number] DOB: 1951-01-06 DOA: 05/16/2016 PCP: Irven Shelling, MD   Brief Narrative: HPI: Connor King is a 65 y.o. male with medical history significant for unprovoked DVT in 2002, obesity, sleep apnea on CPAP. Patient presented to his primary care physician's office today after noticing dyspnea on exertion beginning on Monday. He was working out in his yard when the symptoms began. Symptoms persisted. He is not had any cough fevers chills or other constitutional symptoms suggestive of pneumonia or bronchitis. He has not taken any long trips and has not been hospitalized undergone any surgeries recently. His PCP sent him for CT of the chest which revealed bilateral pulmonary embolism with CT evidence of right heart strain.     Assessment & Plan:   Principal Problem:   Bilateral pulmonary embolism (HCC) Active Problems:   History of DVT (deep vein thrombosis)   Morbid obesity with BMI of 40.0-44.9, adult (HCC)   OSA (obstructive sleep apnea)   Acute hyperglycemia   Varicose veins   Bilateral pulmonary embolism, with right heart strain by CT scan.  Continue with heparin for at least 24  more hours. He has been on heparin for 48 hours.  Follow ECHO. Results pending.  Vitals stable at this time.  Doppler LE negative Needs lifelong anticoagulation.  I have discussed oral options for anticoagulation with patient. Xarelto vs coumadin.  Patient would like to be discharge on coumadin.    Acute hyperglycemia -History of pre-diabetes.  -HgbA1c 6.8. Will need metformin at discharge.   Morbid obesity with BMI of 40.0-44.9, adult /OSA (obstructive sleep apnea) -Continue hour of sleep CPAP-patient's family to bring machine and mask from home    DVT prophylaxis: Heparin  Code Status: Full code.  Family Communication: care discussed with wife who was at bedside.  Disposition Plan: home in 72 hours, continue with heparin Gtt   Consultants:    none   Procedures:   ECHO; Pending  Doppler Pending.    Antimicrobials:   none    Subjective: Feeling better, he notice the difference. Dyspnea improved.    Objective: Vitals:   05/17/16 0400 05/17/16 1336 05/17/16 2100 05/18/16 0510  BP: 123/87 124/80 124/76 111/63  Pulse: 77 71 69 76  Resp: 18 18 18 18   Temp: 98 F (36.7 C) 98.2 F (36.8 C) 98.1 F (36.7 C) 97.8 F (36.6 C)  TempSrc: Oral Oral Oral Axillary  SpO2: 94% 97% 96% 100%  Weight:      Height:        Intake/Output Summary (Last 24 hours) at 05/18/16 1307 Last data filed at 05/18/16 0600  Gross per 24 hour  Intake            331.5 ml  Output                0 ml  Net            331.5 ml   Filed Weights   05/16/16 1354 05/16/16 1809  Weight: (!) 152 kg (335 lb) (!) 149.9 kg (330 lb 6.4 oz)    Examination:  General exam: Appears calm and comfortable  Respiratory system: Clear to auscultation. Respiratory effort normal. Cardiovascular system: S1 & S2 heard, RRR. No JVD, murmurs, rubs, gallops or clicks. No pedal edema. Gastrointestinal system: Abdomen is nondistended, soft and nontender. No organomegaly or masses felt. Normal bowel sounds heard. Central nervous system: Alert and oriented. No focal neurological deficits. Extremities: Symmetric 5 x  5 power. Skin: No rashes, lesions or ulcers Psychiatry: Judgement and insight appear normal. Mood & affect appropriate.     Data Reviewed: I have personally reviewed following labs and imaging studies  CBC:  Recent Labs Lab 05/16/16 1355 05/17/16 0352 05/18/16 0318  WBC 9.2 8.9 7.1  HGB 14.8 14.9 13.3  HCT 44.8 45.4 40.5  MCV 94.5 94.8 94.8  PLT 171 185 [id number]   Basic Metabolic Panel:  Recent Labs Lab 05/16/16 1355 05/16/16 2320 05/17/16 0352  NA 140  --  139  K 4.2  --  4.9  CL 103  --  104  CO2 29  --  28  GLUCOSE 121*  --  139*  BUN 17  --  15  CREATININE 1.11  --  1.08  CALCIUM 9.5 8.9 9.6  MG  --  1.9  --     GFR: Estimated Creatinine Clearance: 105.4 mL/min (by C-G formula based on SCr of 1.08 mg/dL). Liver Function Tests:  Recent Labs Lab 05/16/16 1355  AST 20  ALT 14*  ALKPHOS 75  BILITOT 0.9  PROT 7.1  ALBUMIN 4.0   No results for input(s): LIPASE, AMYLASE in the last 168 hours. No results for input(s): AMMONIA in the last 168 hours. Coagulation Profile:  Recent Labs Lab 05/16/16 1355  INR 1.09   Cardiac Enzymes:  Recent Labs Lab 05/16/16 1355 05/16/16 2320 05/17/16 0352  TROPONINI 0.03*  0.03* <0.03 <0.03   BNP (last 3 results) No results for input(s): PROBNP in the last 8760 hours. HbA1C:  Recent Labs  05/16/16 1600  HGBA1C 6.8*   CBG: No results for input(s): GLUCAP in the last 168 hours. Lipid Profile: No results for input(s): CHOL, HDL, LDLCALC, TRIG, CHOLHDL, LDLDIRECT in the last 72 hours. Thyroid Function Tests: No results for input(s): TSH, T4TOTAL, FREET4, T3FREE, THYROIDAB in the last 72 hours. Anemia Panel: No results for input(s): VITAMINB12, FOLATE, FERRITIN, TIBC, IRON, RETICCTPCT in the last 72 hours. Sepsis Labs: No results for input(s): PROCALCITON, LATICACIDVEN in the last 168 hours.  No results found for this or any previous visit (from the past 240 hour(s)).       Radiology Studies: No results found.      Scheduled Meds:  Continuous Infusions: . heparin 1,950 Units/hr (05/18/16 0134)     LOS: 2 days    Time spent: 35 minutes.     Elmarie Shiley, MD [facility] Pager [phone]  If 7PM-7AM, please contact night-coverage [email or web address] Password [email or web address] 05/18/2016, 1:07 PM

## 2016-05-18 NOTE — Progress Notes (Signed)
Preliminary results by tech - Venous Duplex Lower ext. Completed. Negative for deep and superficial vein thrombosis in both legs. Oda Cogan, BS, RDMS, RVT

## 2016-05-19 LAB — ECHOCARDIOGRAM COMPLETE
HEIGHTINCHES: 74 in
WEIGHTICAEL: 5286.4 [oz_av]

## 2016-05-19 LAB — CBC
HCT: 42.7 % (ref 39.0–52.0)
HEMOGLOBIN: 13.8 g/dL (ref 13.0–17.0)
MCH: 30.5 pg (ref 26.0–34.0)
MCHC: 32.3 g/dL (ref 30.0–36.0)
MCV: 94.5 fL (ref 78.0–100.0)
Platelets: 184 10*3/uL (ref 150–400)
RBC: 4.52 MIL/uL (ref 4.22–5.81)
RDW: 14 % (ref 11.5–15.5)
WBC: 7.1 10*3/uL (ref 4.0–10.5)

## 2016-05-19 LAB — HEPARIN LEVEL (UNFRACTIONATED): Heparin Unfractionated: 0.55 IU/mL (ref 0.30–0.70)

## 2016-05-19 MED ORDER — RIVAROXABAN 15 MG PO TABS
15.0000 mg | ORAL_TABLET | Freq: Two times a day (BID) | ORAL | Status: DC
  Administered 2016-05-19 – 2016-05-20 (×2): 15 mg via ORAL
  Filled 2016-05-19 (×2): qty 1

## 2016-05-19 MED ORDER — RIVAROXABAN 20 MG PO TABS: 20.0000 mg | ORAL_TABLET | Freq: Every day | ORAL | Status: DC

## 2016-05-19 NOTE — Discharge Instructions (Signed)
Information on my medicine - XARELTO (rivaroxaban)  This medication education was reviewed with me or my healthcare representative as part of my discharge preparation.  The pharmacist that spoke with me during my hospital stay was:  Elli Groesbeck, Gay Filler, [place]? Xarelto was prescribed to treat blood clots that may have been found in the veins of your legs (deep vein thrombosis) or in your lungs (pulmonary embolism) and to reduce the risk of them occurring again.  What do you need to know about Xarelto? The starting dose is one 15 mg tablet taken TWICE daily with food for the FIRST 21 DAYS then on October 8th  the dose is changed to one 20 mg tablet taken ONCE A DAY with your evening meal.  DO NOT stop taking Xarelto without talking to the health care provider who prescribed the medication.  Refill your prescription for 20 mg tablets before you run out.  After discharge, you should have regular check-up appointments with your healthcare provider that is prescribing your Xarelto.  In the future your dose may need to be changed if your kidney function changes by a significant amount.  What do you do if you miss a dose? If you are taking Xarelto TWICE DAILY and you miss a dose, take it as soon as you remember. You may take two 15 mg tablets (total 30 mg) at the same time then resume your regularly scheduled 15 mg twice daily the next day.  If you are taking Xarelto ONCE DAILY and you miss a dose, take it as soon as you remember on the same day then continue your regularly scheduled once daily regimen the next day. Do not take two doses of Xarelto at the same time.   Important Safety Information Xarelto is a blood thinner medicine that can cause bleeding. You should call your healthcare provider right away if you experience any of the following: ? Bleeding from an injury or your nose that does not stop. ? Unusual colored urine (red or dark brown) or unusual  colored stools (red or black). ? Unusual bruising for unknown reasons. ? A serious fall or if you hit your head (even if there is no bleeding).  Some medicines may interact with Xarelto and might increase your risk of bleeding while on Xarelto. To help avoid this, consult your healthcare provider or pharmacist prior to using any new prescription or non-prescription medications, including herbals, vitamins, non-steroidal anti-inflammatory drugs (NSAIDs) and supplements.  This website has more information on Xarelto: [email or web address]

## 2016-05-19 NOTE — Progress Notes (Signed)
ANTICOAGULATION CONSULT NOTE - Follow Up Consult  Pharmacy Consult for Heparin > transition to Xarelto Indication: pulmonary embolus  No Known Allergies  Patient Measurements: Height: 6\' 2"  (188 cm) Weight: (!) 330 lb 6.4 oz (149.9 kg) IBW/kg (Calculated) : 82.2 Heparin Dosing Weight: 117 kg  Vital Signs: Temp: 97.5 F (36.4 C) (09/17 0539) Temp Source: Oral (09/17 0539) BP: 104/63 (09/17 0539) Pulse Rate: 71 (09/17 0539)  Labs:  Recent Labs  05/16/16 2320  05/17/16 0352  05/17/16 1834 05/18/16 0318 05/19/16 0246  HGB  --   < > 14.9  --   --  13.3 13.8  HCT  --   --  45.4  --   --  40.5 42.7  PLT  --   --  185  --   --  170 184  HEPARINUNFRC  --   < >  --   < > 0.42 0.60 0.55  CREATININE  --   --  1.08  --   --   --   --   TROPONINI <0.03  --  <0.03  --   --   --   --   < > = values in this interval not displayed.  Estimated Creatinine Clearance: 105.4 mL/min (by C-G formula based on SCr of 1.08 mg/dL).   . heparin 1,950 Units/hr (05/19/16 0524)     Assessment: 65 yr old male admitted last night with dyspnea. Found to have bilateral PE with right heart strain.  Hx DVT in 2002.  Heparin level therapeutic on heparin 1950 units/hr.  No bleeding or complications noted, CBC stable.  Heparin level therapeutic this AM.  Pharmacy asked to transition to Xarelto in anticipation of discharge.   Goal of Therapy:  Heparin level 0.3-0.7 units/ml Monitor platelets by anticoagulation protocol: Yes   Plan:  D/c heparin at 1630 PM Give 1st Xarelto dose at 1630 PM.  Needs Xarelto 15 mg BID x 21 days, then Xarelto 20 mg daily Will complete patient education prior to discharge.  Uvaldo Rising, BCPS  Clinical Pharmacist Pager [phone]  05/19/2016 2:12 PM

## 2016-05-19 NOTE — Progress Notes (Signed)
PROGRESS NOTE    Waldon Connor King  [id number] DOB: 08/27/1951 DOA: 05/16/2016 PCP: Irven Shelling, MD   Brief Narrative: HPI: Connor King is a 65 y.o. male with medical history significant for unprovoked DVT in 2002, obesity, sleep apnea on CPAP. Patient presented to his primary care physician's office today after noticing dyspnea on exertion beginning on Monday. He was working out in his yard when the symptoms began. Symptoms persisted. He is not had any cough fevers chills or other constitutional symptoms suggestive of pneumonia or bronchitis. He has not taken any long trips and has not been hospitalized undergone any surgeries recently. His PCP sent him for CT of the chest which revealed bilateral pulmonary embolism with CT evidence of right heart strain.     Assessment & Plan:   Principal Problem:   Bilateral pulmonary embolism (HCC) Active Problems:   History of DVT (deep vein thrombosis)   Morbid obesity with BMI of 40.0-44.9, adult (HCC)   OSA (obstructive sleep apnea)   Acute hyperglycemia   Varicose veins   Bilateral pulmonary embolism, with right heart strain by CT scan.  Continue with heparin for at least 24  more hours. He has been on heparin for 48 hours.  Follow ECHO, discussed with cardiology no significant Right side heart strain.  Vitals stable at this time.  Doppler LE negative Needs lifelong anticoagulation.  I have discussed oral options for anticoagulation with patient. Xarelto vs coumadin.  Patient would like to be discharge on Xarelto.  Will transition to [place] today.    Acute hyperglycemia -History of pre-diabetes.  -HgbA1c 6.8. Will need metformin at discharge.   Morbid obesity with BMI of 40.0-44.9, adult /OSA (obstructive sleep apnea) -Continue hour of sleep CPAP-patient's family to bring machine and mask from home    DVT prophylaxis: Heparin  Code Status: Full code.  Family Communication: care discussed with wife who was at  bedside.  Disposition Plan: home in 72 hours, continue with heparin Gtt   Consultants:  none   Procedures:   ECHO; Pending  Doppler negative   Antimicrobials:   none    Subjective: Feeling better, he notice the difference. Dyspnea improved.    Objective: Vitals:   05/18/16 0510 05/18/16 1430 05/18/16 2030 05/19/16 0539  BP: 111/63 123/71 125/62 104/63  Pulse: 76 79 69 71  Resp: 18 18 18 19   Temp: 97.8 F (36.6 C) 98.2 F (36.8 C) 98.4 F (36.9 C) 97.5 F (36.4 C)  TempSrc: Axillary Oral Oral Oral  SpO2: 100% 97% 98% 97%  Weight:      Height:        Intake/Output Summary (Last 24 hours) at 05/19/16 1337 Last data filed at 05/19/16 0800  Gross per 24 hour  Intake           861.33 ml  Output                0 ml  Net           861.33 ml   Filed Weights   05/16/16 1354 05/16/16 1809  Weight: (!) 152 kg (335 lb) (!) 149.9 kg (330 lb 6.4 oz)    Examination:  General exam: Appears calm and comfortable  Respiratory system: Clear to auscultation. Respiratory effort normal. Cardiovascular system: S1 & S2 heard, RRR. No JVD, murmurs, rubs, gallops or clicks. No pedal edema. Gastrointestinal system: Abdomen is nondistended, soft and nontender. No organomegaly or masses felt. Normal bowel sounds heard. Central nervous system: Alert and  oriented. No focal neurological deficits. Extremities: Symmetric 5 x 5 power. Skin: No rashes, lesions or ulcers Psychiatry: Judgement and insight appear normal. Mood & affect appropriate.     Data Reviewed: I have personally reviewed following labs and imaging studies  CBC:  Recent Labs Lab 05/16/16 1355 05/17/16 0352 05/18/16 0318 05/19/16 0246  WBC 9.2 8.9 7.1 7.1  HGB 14.8 14.9 13.3 13.8  HCT 44.8 45.4 40.5 42.7  MCV 94.5 94.8 94.8 94.5  PLT 171 185 170 [id number]   Basic Metabolic Panel:  Recent Labs Lab 05/16/16 1355 05/16/16 2320 05/17/16 0352  NA 140  --  139  K 4.2  --  4.9  CL 103  --  104  CO2 29  --  28    GLUCOSE 121*  --  139*  BUN 17  --  15  CREATININE 1.11  --  1.08  CALCIUM 9.5 8.9 9.6  MG  --  1.9  --    GFR: Estimated Creatinine Clearance: 105.4 mL/min (by C-G formula based on SCr of 1.08 mg/dL). Liver Function Tests:  Recent Labs Lab 05/16/16 1355  AST 20  ALT 14*  ALKPHOS 75  BILITOT 0.9  PROT 7.1  ALBUMIN 4.0   No results for input(s): LIPASE, AMYLASE in the last 168 hours. No results for input(s): AMMONIA in the last 168 hours. Coagulation Profile:  Recent Labs Lab 05/16/16 1355  INR 1.09   Cardiac Enzymes:  Recent Labs Lab 05/16/16 1355 05/16/16 2320 05/17/16 0352  TROPONINI 0.03*  0.03* <0.03 <0.03   BNP (last 3 results) No results for input(s): PROBNP in the last 8760 hours. HbA1C:  Recent Labs  05/16/16 1600  HGBA1C 6.8*   CBG: No results for input(s): GLUCAP in the last 168 hours. Lipid Profile: No results for input(s): CHOL, HDL, LDLCALC, TRIG, CHOLHDL, LDLDIRECT in the last 72 hours. Thyroid Function Tests: No results for input(s): TSH, T4TOTAL, FREET4, T3FREE, THYROIDAB in the last 72 hours. Anemia Panel: No results for input(s): VITAMINB12, FOLATE, FERRITIN, TIBC, IRON, RETICCTPCT in the last 72 hours. Sepsis Labs: No results for input(s): PROCALCITON, LATICACIDVEN in the last 168 hours.  No results found for this or any previous visit (from the past 240 hour(s)).       Radiology Studies: No results found.      Scheduled Meds:  Continuous Infusions: . heparin 1,950 Units/hr (05/19/16 0524)     LOS: 3 days    Time spent: 35 minutes.     Elmarie Shiley, MD [facility] Pager [phone]  If 7PM-7AM, please contact night-coverage [email or web address] Password [email or web address] 05/19/2016, 1:37 PM

## 2016-05-20 ENCOUNTER — Encounter (HOSPITAL_COMMUNITY): Payer: Self-pay | Admitting: General Practice

## 2016-05-20 LAB — CBC
HCT: 40.6 % (ref 39.0–52.0)
Hemoglobin: 13.1 g/dL (ref 13.0–17.0)
MCH: 30.7 pg (ref 26.0–34.0)
MCHC: 32.3 g/dL (ref 30.0–36.0)
MCV: 95.1 fL (ref 78.0–100.0)
PLATELETS: 180 10*3/uL (ref 150–400)
RBC: 4.27 MIL/uL (ref 4.22–5.81)
RDW: 14.1 % (ref 11.5–15.5)
WBC: 7.8 10*3/uL (ref 4.0–10.5)

## 2016-05-20 LAB — HEPARIN LEVEL (UNFRACTIONATED): Heparin Unfractionated: 1.7 IU/mL — ABNORMAL HIGH (ref 0.30–0.70)

## 2016-05-20 MED ORDER — RIVAROXABAN (XARELTO) VTE STARTER PACK (15 & 20 MG): ORAL_TABLET | ORAL | 0 refills | Status: DC

## 2016-05-20 NOTE — Progress Notes (Signed)
Telford Carloyn Manner to be D/C'd Home per MD order. Discussed with the patient and all questions fully answered.    Medication List    STOP taking these medications   aspirin EC 81 MG tablet     TAKE these medications   cholecalciferol 1000 units tablet Commonly known as:  VITAMIN D Take 1,000 Units by mouth daily.   Rivaroxaban 15 & 20 MG Tbpk Take as directed on package: Start with one 15mg  tablet by mouth twice a day with food. On Day 22, switch to one 20mg  tablet once a day with food.       VVS, Skin clean, dry and intact without evidence of skin break down, no evidence of skin tears noted.  IV catheter discontinued intact. Site without signs and symptoms of complications. Dressing and pressure applied.  An After Visit Summary was printed and given to the patient.  Patient escorted via [place], and D/C home via private auto.  [place]yndra Numbers  05/20/2016 11:37 AM

## 2016-05-20 NOTE — Care Management Note (Signed)
Case Management Note Marvetta Gibbons RN, BSN Unit 2W-Case Manager [phone]  Patient Details  Name: Connor King MRN: [id number] Date of Birth: 1951-02-12  Subjective/Objective:  Pt admitted with bil PE                  Action/Plan: PTA pt lived at home with wife- anticipate return home- consult received for NOAC benefit check- MD has selected Xarelto- per insurance check Marga Hoots @ [employer] # [phone]   XARELTO 20 MG DAILY ( 30 ) 30 TAB  COVER- YES  CO-PAY- $ 40.00  TIER- 2 DRUG  PRIOR APPROVAL- YES # [phone]   Spoke with pt and wife at bedside- 30 day free card given to use on discharge- coverage info shared along with pre-auth needed prior to next month fill. Pt to f/u with primary care regarding this - MD has written for starter pack- explained to pt where to go to have this filled if his pharmacy does not have it in stock for today.   Expected Discharge Date:    05/20/16              Expected Discharge Plan:  Home/Self Care  In-House Referral:     Discharge planning Services  CM Consult, Medication Assistance  Post Acute Care Choice:    Choice offered to:     DME Arranged:    DME Agency:     HH Arranged:    HH Agency:     Status of Service:  Completed, signed off  If discussed at [employer] of Stay Meetings, dates discussed:    Additional Comments:  Dawayne Patricia, RN 05/20/2016, 11:31 AM

## 2016-05-20 NOTE — Care Management Important Message (Signed)
Important Message  Patient Details  Name: Connor King MRN: [id number] Date of Birth: 25-Jun-1951   Medicare Important Message Given:  Yes    Nathen May 05/20/2016, 9:44 AM

## 2016-05-20 NOTE — Discharge Summary (Signed)
Physician Discharge Summary  Connor King [id number] DOB: 09/24/1950 DOA: 05/16/2016  PCP: Irven Shelling, MD  Admit date: 05/16/2016 Discharge date: 05/20/2016  Admitted From: home Disposition: home  Recommendations for Outpatient Follow-up:  1. Follow up with PCP in 1-2 weeks 2. Please obtain BMP/CBC in one week 3. Needs appropriate work up for malignancy screening , consider hypercoagulable work up.  4. Further anticoagulation per PCP.  5. Encourage weight loss.    Discharge Condition: stable.  CODE STATUS; full code.  Diet recommendation: Heart Healthy / Carb Modified  Brief/Interim Summary: Bilateral pulmonary embolism, with right heart strain by CT scan.  Continue with heparin for at least 24  more hours. He has been on heparin for 48 hours.  Follow ECHO, discussed with cardiology no significant Right side heart strain.  Vitals stable at this time.  Doppler LE negative Needs lifelong anticoagulation.  I have discussed oral options for anticoagulation with patient. Xarelto vs coumadin.  Discharge on Xarelto.   Mild diastolic dysfunction;  Encourage weight loss.   Acute hyperglycemia -History of pre-diabetes.  -HgbA1c 6.8. Consider metformin   Morbid obesity with BMI of 40.0-44.9, adult /OSA (obstructive sleep apnea) -Continue hour of sleep CPAP-patient's family to bring machine and mask from home   Discharge Diagnoses:  Principal Problem:   Bilateral pulmonary embolism ([place]) Active Problems:   History of DVT (deep vein thrombosis)   Morbid obesity with BMI of 40.0-44.9, adult (HCC)   OSA (obstructive sleep apnea)   Acute hyperglycemia   Varicose veins    Discharge Instructions  Discharge Instructions    Diet - low sodium heart healthy    Complete by:  As directed    Increase activity slowly    Complete by:  As directed        Medication List    STOP taking these medications   aspirin EC 81 MG tablet     TAKE these medications    cholecalciferol 1000 units tablet Commonly known as:  VITAMIN D Take 1,000 Units by mouth daily.   Rivaroxaban 15 & 20 MG Tbpk Take as directed on package: Start with one 15mg  tablet by mouth twice a day with food. On Day 22, switch to one 20mg  tablet once a day with food.       No Known Allergies  Consultations:  none   Procedures/Studies: Ct Angio Chest Pe W Or Wo Contrast  Result Date: 05/16/2016 CLINICAL DATA:  Shortness of breath and dyspnea on exertion for the past 3 days. Fatigue. EXAM: CT ANGIOGRAPHY CHEST WITH CONTRAST TECHNIQUE: Multidetector CT imaging of the chest was performed using the standard protocol during bolus administration of intravenous contrast. Multiplanar CT image reconstructions and MIPs were obtained to evaluate the vascular anatomy. CONTRAST:  75 cc Isovue 370 COMPARISON:  None. FINDINGS: Cardiovascular: Multiple bilateral pulmonary arterial filling defects. These include moderate-large si central pulmonary artery filling defects, greater on the right. Normal sized heart. The right ventricular to left ventricular ratio is 1.7. Mediastinum/Nodes: No mediastinal mass or enlarged lymph nodes. Lungs/Pleura: Lungs are clear. No pleural effusion or pneumothorax. Upper Abdomen: Cholecystectomy clips.  7 mm left renal calculus. Musculoskeletal: Thoracic spine degenerative changes. Bilateral AC joint degenerative changes. Review of the MIP images confirms the above findings. IMPRESSION: 1. Positive for acute PE with CT evidence of right heart strain (RV/LV Ratio = 1.7) consistent with at least submassive (intermediate risk) PE. The presence of right heart strain has been associated with an increased risk of morbidity and mortality.  Please activate Code PE by paging [phone]. 2. 7 mm nonobstructing left renal calculus. Critical Value/emergent results were called by telephone at the time of interpretation on 05/16/2016 at 1:13 pm to Dr. Lavone Orn , who verbally  acknowledged these results. Electronically Signed   By: Claudie Revering M.D.   On: 05/16/2016 13:17   ECHO;Left ventricle: The cavity size was normal. There was mild   concentric hypertrophy. Systolic function was normal. The   estimated ejection fraction was in the range of 50% to 55%. Wall   motion was normal; there were no regional wall motion   abnormalities. Doppler parameters are consistent with abnormal   left ventricular relaxation (grade 1 diastolic dysfunction).   There was no evidence of elevated ventricular filling pressure by   Doppler parameters.   Subjective: Feeling well ,denies dyspnea  Discharge Exam: Vitals:   05/19/16 1934 05/20/16 0611  BP: 130/71 116/69  Pulse: 67 66  Resp: 19 19  Temp: 97.8 F (36.6 C) 97.8 F (36.6 C)   Vitals:   05/19/16 0539 05/19/16 1443 05/19/16 1934 05/20/16 0611  BP: 104/63 135/74 130/71 116/69  Pulse: 71 66 67 66  Resp: 19 18 19 19   Temp: 97.5 F (36.4 C) 98 F (36.7 C) 97.8 F (36.6 C) 97.8 F (36.6 C)  TempSrc: Oral Oral Oral Oral  SpO2: 97% 100% 96% 96%  Weight:      Height:        General: Pt is alert, awake, not in acute distress Cardiovascular: RRR, S1/S2 +, no rubs, no gallops Respiratory: CTA bilaterally, no wheezing, no rhonchi Abdominal: Soft, NT, ND, bowel sounds + Extremities: no edema, no cyanosis    The results of significant diagnostics from this hospitalization (including imaging, microbiology, ancillary and laboratory) are listed below for reference.     Microbiology: No results found for this or any previous visit (from the past 240 hour(s)).   Labs: BNP (last 3 results)  Recent Labs  05/16/16 1355  BNP [id number]*   Basic Metabolic Panel:  Recent Labs Lab 05/16/16 1355 05/16/16 2320 05/17/16 0352  NA 140  --  139  K 4.2  --  4.9  CL 103  --  104  CO2 29  --  28  GLUCOSE 121*  --  139*  BUN 17  --  15  CREATININE 1.11  --  1.08  CALCIUM 9.5 8.9 9.6  MG  --  1.9  --    Liver  Function Tests:  Recent Labs Lab 05/16/16 1355  AST 20  ALT 14*  ALKPHOS 75  BILITOT 0.9  PROT 7.1  ALBUMIN 4.0   No results for input(s): LIPASE, AMYLASE in the last 168 hours. No results for input(s): AMMONIA in the last 168 hours. CBC:  Recent Labs Lab 05/16/16 1355 05/17/16 0352 05/18/16 0318 05/19/16 0246 05/20/16 0205  WBC 9.2 8.9 7.1 7.1 7.8  HGB 14.8 14.9 13.3 13.8 13.1  HCT 44.8 45.4 40.5 42.7 40.6  MCV 94.5 94.8 94.8 94.5 95.1  PLT 171 185 170 184 180   Cardiac Enzymes:  Recent Labs Lab 05/16/16 1355 05/16/16 2320 05/17/16 0352  TROPONINI 0.03*  0.03* <0.03 <0.03   BNP: Invalid input(s): POCBNP CBG: No results for input(s): GLUCAP in the last 168 hours. D-Dimer No results for input(s): DDIMER in the last 72 hours. Hgb A1c No results for input(s): HGBA1C in the last 72 hours. Lipid Profile No results for input(s): CHOL, HDL, LDLCALC, TRIG, CHOLHDL, LDLDIRECT in the  last 72 hours. Thyroid function studies No results for input(s): TSH, T4TOTAL, T3FREE, THYROIDAB in the last 72 hours.  Invalid input(s): FREET3 Anemia work up No results for input(s): VITAMINB12, FOLATE, FERRITIN, TIBC, IRON, RETICCTPCT in the last 72 hours. Urinalysis    Component Value Date/Time   COLORURINE YELLOW 08/03/2009 1252   APPEARANCEUR CLEAR 08/03/2009 1252   LABSPEC 1.019 08/03/2009 1252   PHURINE 5.0 08/03/2009 1252   GLUCOSEU NEGATIVE 08/03/2009 1252   HGBUR NEGATIVE 08/03/2009 1252   BILIRUBINUR NEGATIVE 08/03/2009 1252   KETONESUR NEGATIVE 08/03/2009 1252   PROTEINUR NEGATIVE 08/03/2009 1252   UROBILINOGEN 0.2 08/03/2009 1252   NITRITE NEGATIVE 08/03/2009 1252   LEUKOCYTESUR  08/03/2009 1252    NEGATIVE MICROSCOPIC NOT DONE ON URINES WITH NEGATIVE PROTEIN, BLOOD, LEUKOCYTES, NITRITE, OR GLUCOSE <1000 mg/dL.   Sepsis Labs Invalid input(s): PROCALCITONIN,  WBC,  LACTICIDVEN Microbiology No results found for this or any previous visit (from the past 240  hour(s)).   Time coordinating discharge: Over 30 minutes  SIGNED:   Elmarie Shiley, MD  [facility] 05/20/2016, 11:11 AM Pager   If 7PM-7AM, please contact night-coverage [email or web address] Password [email or web address]

## 2017-03-18 ENCOUNTER — Other Ambulatory Visit: Payer: Self-pay | Admitting: Gastroenterology

## 2017-03-19 ENCOUNTER — Other Ambulatory Visit: Payer: Self-pay | Admitting: Gastroenterology

## 2017-03-25 ENCOUNTER — Encounter (HOSPITAL_COMMUNITY): Payer: Self-pay | Admitting: *Deleted

## 2017-04-01 ENCOUNTER — Encounter (HOSPITAL_COMMUNITY)
Admission: RE | Disposition: A | Discharge status: Home / Self Care | Payer: Self-pay | Source: Ambulatory Visit | Attending: Gastroenterology

## 2017-04-01 ENCOUNTER — Encounter (HOSPITAL_COMMUNITY): Payer: Self-pay | Admitting: Certified Registered Nurse Anesthetist

## 2017-04-01 ENCOUNTER — Ambulatory Visit (HOSPITAL_COMMUNITY)
Admission: RE | Admit: 2017-04-01 | Discharge: 2017-04-01 | Disposition: A | Discharge status: Home / Self Care | Payer: Medicare Other | Source: Ambulatory Visit | Attending: Gastroenterology | Admitting: Gastroenterology

## 2017-04-01 ENCOUNTER — Ambulatory Visit (HOSPITAL_COMMUNITY): Payer: Medicare Other | Admitting: Certified Registered Nurse Anesthetist

## 2017-04-01 DIAGNOSIS — Z87442 Personal history of urinary calculi: Secondary | ICD-10-CM | POA: Diagnosis not present

## 2017-04-01 DIAGNOSIS — Z6841 Body Mass Index (BMI) 40.0 and over, adult: Secondary | ICD-10-CM | POA: Insufficient documentation

## 2017-04-01 DIAGNOSIS — Z96652 Presence of left artificial knee joint: Secondary | ICD-10-CM | POA: Insufficient documentation

## 2017-04-01 DIAGNOSIS — D123 Benign neoplasm of transverse colon: Secondary | ICD-10-CM | POA: Insufficient documentation

## 2017-04-01 DIAGNOSIS — K621 Rectal polyp: Secondary | ICD-10-CM | POA: Insufficient documentation

## 2017-04-01 DIAGNOSIS — Z79899 Other long term (current) drug therapy: Secondary | ICD-10-CM | POA: Diagnosis not present

## 2017-04-01 DIAGNOSIS — Z85038 Personal history of other malignant neoplasm of large intestine: Secondary | ICD-10-CM | POA: Diagnosis present

## 2017-04-01 DIAGNOSIS — Z1211 Encounter for screening for malignant neoplasm of colon: Secondary | ICD-10-CM | POA: Diagnosis not present

## 2017-04-01 DIAGNOSIS — Z7901 Long term (current) use of anticoagulants: Secondary | ICD-10-CM | POA: Insufficient documentation

## 2017-04-01 DIAGNOSIS — Z86718 Personal history of other venous thrombosis and embolism: Secondary | ICD-10-CM | POA: Diagnosis not present

## 2017-04-01 DIAGNOSIS — G4730 Sleep apnea, unspecified: Secondary | ICD-10-CM | POA: Diagnosis not present

## 2017-04-01 DIAGNOSIS — K449 Diaphragmatic hernia without obstruction or gangrene: Secondary | ICD-10-CM | POA: Diagnosis not present

## 2017-04-01 HISTORY — DX: Personal history of urinary calculi: Z87.442

## 2017-04-01 HISTORY — PX: COLONOSCOPY WITH PROPOFOL: SHX5780

## 2017-04-01 LAB — GLUCOSE, CAPILLARY: Glucose-Capillary: 150 mg/dL — ABNORMAL HIGH (ref 65–99)

## 2017-04-01 SURGERY — COLONOSCOPY WITH PROPOFOL
Anesthesia: Monitor Anesthesia Care

## 2017-04-01 MED ORDER — PROPOFOL 10 MG/ML IV BOLUS
INTRAVENOUS | Status: AC
  Filled 2017-04-01: qty 60

## 2017-04-01 MED ORDER — LIDOCAINE HCL (CARDIAC) 20 MG/ML IV SOLN
INTRAVENOUS | Status: DC | PRN
  Administered 2017-04-01: 100 mg via INTRATRACHEAL

## 2017-04-01 MED ORDER — PROPOFOL 500 MG/50ML IV EMUL
INTRAVENOUS | Status: DC | PRN
  Administered 2017-04-01: 150 ug/kg/min via INTRAVENOUS

## 2017-04-01 MED ORDER — ONDANSETRON HCL 4 MG/2ML IJ SOLN
INTRAMUSCULAR | Status: AC
  Filled 2017-04-01: qty 2

## 2017-04-01 MED ORDER — ONDANSETRON HCL 4 MG/2ML IJ SOLN
INTRAMUSCULAR | Status: DC | PRN
  Administered 2017-04-01: 4 mg via INTRAVENOUS

## 2017-04-01 MED ORDER — DEXAMETHASONE SODIUM PHOSPHATE 10 MG/ML IJ SOLN
INTRAMUSCULAR | Status: DC | PRN
  Administered 2017-04-01: 10 mg via INTRAVENOUS

## 2017-04-01 MED ORDER — SODIUM CHLORIDE 0.9 % IV SOLN: INTRAVENOUS | Status: DC

## 2017-04-01 MED ORDER — LACTATED RINGERS IV SOLN: INTRAVENOUS | Status: DC

## 2017-04-01 MED ORDER — DEXAMETHASONE SODIUM PHOSPHATE 10 MG/ML IJ SOLN
INTRAMUSCULAR | Status: AC
  Filled 2017-04-01: qty 1

## 2017-04-01 MED ORDER — LIDOCAINE 2% (20 MG/ML) 5 ML SYRINGE
INTRAMUSCULAR | Status: AC
  Filled 2017-04-01: qty 5

## 2017-04-01 MED ORDER — LACTATED RINGERS IV SOLN
INTRAVENOUS | Status: DC
  Administered 2017-04-01 (×2): via INTRAVENOUS

## 2017-04-01 MED ORDER — PROPOFOL 10 MG/ML IV BOLUS
INTRAVENOUS | Status: DC | PRN
  Administered 2017-04-01: 50 mg via INTRAVENOUS

## 2017-04-01 SURGICAL SUPPLY — 21 items

## 2017-04-01 NOTE — Op Note (Signed)
[facility] Patient Name: Connor King Procedure Date: 04/01/2017 MRN: [id number] Attending MD: Ronald Lobo , MD Date of Birth: Jan 22, 1951 CSN: [id number] Age: 66 Admit Type: Outpatient Procedure:                Colonoscopy Indications:              High risk colon cancer surveillance: Personal                            history of colon cancer, Last colonoscopy: August                            2012 Providers:                Ronald Lobo, MD, Burtis Junes, RN, Elspeth Cho                            Tech., Technician, Stephanie [place] CRNA Referring MD:              Medicines:                Monitored Anesthesia Care Complications:            No immediate complications. Estimated Blood Loss:     Estimated blood loss was minimal. Procedure:                Pre-Anesthesia Assessment:                           - Prior to the procedure, a History and Physical                            was performed, and patient medications and                            allergies were reviewed. The patient's tolerance of                            previous anesthesia was also reviewed. The risks                            and benefits of the procedure and the sedation                            options and risks were discussed with the patient.                            All questions were answered, and informed consent                            was obtained. Prior Anticoagulants: The patient has                            taken Xarelto (rivaroxaban), last dose was 1 day  prior to procedure. ASA Grade Assessment: III - A                            patient with severe systemic disease. After                            reviewing the risks and benefits, the patient was                            deemed in satisfactory condition to undergo the                            procedure.                           After obtaining informed consent, the colonoscope                            was passed under direct vision. Throughout the                            procedure, the patient's blood pressure, pulse, and                            oxygen saturations were monitored continuously. The                            EC-[id number] ([id number]) scope was introduced through                            the anus and advanced to the the cecum, identified                            by appendiceal orifice and ileocecal valve. The                            colonoscopy was performed without difficulty. The                            patient tolerated the procedure well. The quality                            of the bowel preparation was excellent. The                            ileocecal valve, appendiceal orifice, and rectum                            were photographed. Scope In: 8:19:20 AM Scope Out: 8:42:26 AM Scope Withdrawal Time: 0 hours 20 minutes 29 seconds  Total Procedure Duration: 0 hours 23 minutes 6 seconds  Findings:      The digital rectal exam was normal. Pertinent negatives include normal       prostate (size, shape, and consistency).      A 4  mm polyp was found in the transverse colon. The polyp was sessile.       The polyp was removed with a cold snare. Resection and retrieval were       complete. Estimated blood loss was minimal.      A 4 mm polyp was found in the rectum (benign-appearing lesion). The       polyp was sessile. The polyp was removed with a cold snare. Resection       and retrieval were complete. Estimated blood loss was minimal.      A few medium-mouthed diverticula were found in the descending colon.      There was evidence of a prior end-to-end colo-colonic anastomosis at 25       cm proximal to the anus. This was characterized by healthy appearing       mucosa.      The retroflexed view of the distal rectum and anal verge was normal and       showed no anal or rectal abnormalities. Impression:               - One 4 mm polyp  in the transverse colon, removed                            with a cold snare. Resected and retrieved.                           - One benign appearing 4 mm polyp in the rectum,                            removed with a cold snare. Resected and retrieved.                           - Diverticulosis in the descending colon.                           - End-to-end colo-colonic anastomosis,                            characterized by healthy appearing mucosa. Moderate Sedation:      This patient was sedated with monitored anesthesia care, not moderate       sedation. Recommendation:           - Await pathology results.                           - Repeat colonoscopy in 5 years for surveillance.                           - Resume previous diet.                           - Continue present medications.                           - Resume Xarelto (rivaroxaban) at prior dose today. Procedure Code(s):        --- Professional ---  45385, Colonoscopy, flexible; with removal of                            tumor(s), polyp(s), or other lesion(s) by snare                            technique Diagnosis Code(s):        --- Professional ---                           A54.098, Personal history of other malignant                            neoplasm of large intestine                           D12.3, Benign neoplasm of transverse colon (hepatic                            flexure or splenic flexure) CPT copyright 2016 American Medical Association. All rights reserved. The codes documented in this report are preliminary and upon coder review may  be revised to meet current compliance requirements. Ronald Lobo, MD 04/01/2017 8:50:49 AM This report has been signed electronically. Number of Addenda: 0

## 2017-04-01 NOTE — Anesthesia Postprocedure Evaluation (Signed)
Anesthesia Post Note  Patient: Connor King  Procedure(s) Performed: Procedure(s) (LRB): COLONOSCOPY WITH PROPOFOL (N/A)     Patient location during evaluation: PACU Anesthesia Type: MAC Level of consciousness: awake Pain management: pain level controlled Vital Signs Assessment: post-procedure vital signs reviewed and stable Respiratory status: spontaneous breathing Cardiovascular status: stable Anesthetic complications: no    Last Vitals:  Vitals:   04/01/17 0847 04/01/17 0850  BP: (!) 98/51 (!) 89/62  Pulse: (!) 59 (!) 56  Resp: 12 13  Temp:      Last Pain:  Vitals:   04/01/17 0711  TempSrc: Oral                 Meldon Hanzlik

## 2017-04-01 NOTE — Transfer of Care (Signed)
Immediate Anesthesia Transfer of Care Note  Patient: Connor King  Procedure(s) Performed: Procedure(s): COLONOSCOPY WITH PROPOFOL (N/A)  Patient Location: PACU  Anesthesia Type:MAC  Level of Consciousness: awake, alert  and oriented  Airway & Oxygen Therapy: Patient Spontanous Breathing  Post-op Assessment: Report given to RN and Post -op Vital signs reviewed and stable  Post vital signs: Reviewed and stable  Last Vitals:  Vitals:   04/01/17 0847 04/01/17 0850  BP: (!) 98/51 (!) 89/62  Pulse: (!) 59 (!) 56  Resp: 12 13  Temp:      Last Pain:  Vitals:   04/01/17 0711  TempSrc: Oral         Complications: No apparent anesthesia complications

## 2017-04-01 NOTE — Anesthesia Preprocedure Evaluation (Signed)
Anesthesia Evaluation  Patient identified by MRN, date of birth, ID band Patient awake    Reviewed: Allergy & Precautions, NPO status , Patient's Chart, lab work & pertinent test results  Airway Mallampati: II  TM Distance: >3 FB     Dental   Pulmonary sleep apnea ,    breath sounds clear to auscultation       Cardiovascular  Rhythm:Regular Rate:Normal     Neuro/Psych    GI/Hepatic Neg liver ROS, hiatal hernia,   Endo/Other  negative endocrine ROS  Renal/GU negative Renal ROS     Musculoskeletal   Abdominal   Peds  Hematology   Anesthesia Other Findings   Reproductive/Obstetrics                             Anesthesia Physical Anesthesia Plan  ASA: III  Anesthesia Plan: MAC   Post-op Pain Management:    Induction: Intravenous  PONV Risk Score and Plan: 1 and Ondansetron and Dexamethasone  Airway Management Planned: Nasal Cannula  Additional Equipment:   Intra-op Plan:   Post-operative Plan:   Informed Consent: I have reviewed the patients History and Physical, chart, labs and discussed the procedure including the risks, benefits and alternatives for the proposed anesthesia with the patient or authorized representative who has indicated his/her understanding and acceptance.   Dental advisory given  Plan Discussed with: CRNA and Anesthesiologist  Anesthesia Plan Comments:         Anesthesia Quick Evaluation

## 2017-04-01 NOTE — H&P (Signed)
Connor King is an 66 y.o. male.   Chief Complaint: colon polyp surveillance HPI: remote h/o sig resn for malig p, w/ subsequent surveillance exams showing diminutive adenomas, most recently 2012.  Pt is morbidly obese and is on Xarelto (most recently yest morning) for h/o PE about 1 yr ago.  Past Medical History:  Diagnosis Date  . Colon cancer ([place])    cancerous polyp was removed  . DVT (deep venous thrombosis) ([facility])   . Hiatal hernia   . History of kidney stones   . Obese   . PE (pulmonary embolism) 05/2016   bilateral  . Pre-diabetes   . Sleep apnea    cpap     Past Surgical History:  Procedure Laterality Date  . CHOLECYSTECTOMY  07/25/2012   Procedure: LAPAROSCOPIC CHOLECYSTECTOMY WITH INTRAOPERATIVE CHOLANGIOGRAM;  Surgeon: Edward Jolly, MD;  Location: [facility];  Service: General;  Laterality: N/A;  . COLON SURGERY    . HERNIA REPAIR    . REPLACEMENT TOTAL KNEE     left knee  . TENDON REPAIR Right 2011    History reviewed. No pertinent family history. Social History:  reports that he has never smoked. He has never used smokeless tobacco. He reports that he does not drink alcohol or use drugs.  Allergies: No Known Allergies  Medications Prior to Admission  Medication Sig Dispense Refill  . acetaminophen (TYLENOL) 500 MG tablet Take 1,000 mg by mouth every 6 (six) hours as needed for mild pain or headache.    . cholecalciferol (VITAMIN D) 1000 units tablet Take 1,000 Units by mouth daily.    . Cyanocobalamin (VITAMIN B 12 PO) Take 1,000 mcg by mouth daily.    Marland Kitchen GAVILYTE-N WITH FLAVOR PACK 420 g solution Take 420 g by mouth See admin instructions.  0  . rivaroxaban (XARELTO) 20 MG TABS tablet Take 20 mg by mouth daily.    . hyoscyamine (ANASPAZ) 0.125 MG TBDP disintergrating tablet Take 0.125 mg by mouth daily as needed for diarrhea or loose stools.  1    Results for orders placed or performed during the hospital encounter of 04/01/17 (from the past 48  hour(s))  Glucose, capillary     Status: Abnormal   Collection Time: 04/01/17  7:22 AM  Result Value Ref Range   Glucose-Capillary 150 (H) 65 - 99 mg/dL   No results found.  ROS see HPI  Blood pressure (!) 151/89, pulse 60, temperature 98.4 F (36.9 C), temperature source Oral, resp. rate (!) 22, height 6' 2"  (1.88 m), weight (!) 149.2 kg (329 lb), SpO2 93 %. Physical Exam  Obese, alert, pleasant, no pallor or icterus, chest clr, heart nl, abd NT, no overt neuro deficits  Assessment/Plan H/o colon polyps for surveillance colonoscopy under MAC  Cleotis Nipper, MD 04/01/2017, 8:07 AM

## 2017-04-01 NOTE — Discharge Instructions (Signed)
RESUME XARELTO AT 12 NOON TODAY.  Call if bleeding or other problems.   Colonoscopy, Adult, Care After This sheet gives you information about how to care for yourself after your procedure. Your health care provider may also give you more specific instructions. If you have problems or questions, contact your health care provider. What can I expect after the procedure? After the procedure, it is common to have:  A small amount of blood in your stool for 24 hours after the procedure.  Some gas.  Mild abdominal cramping or bloating.  Follow these instructions at home: General instructions   For the first 24 hours after the procedure: ? Do not drive or use machinery. ? Do not sign important documents. ? Do not drink alcohol. ? Do your regular daily activities at a slower pace than normal. ? Eat soft, easy-to-digest foods. ? Rest often.  Take over-the-counter or prescription medicines only as told by your health care provider.  It is up to you to get the results of your procedure. Ask your health care provider, or the department performing the procedure, when your results will be ready. Relieving cramping and bloating  Try walking around when you have cramps or feel bloated.  Apply heat to your abdomen as told by your health care provider. Use a heat source that your health care provider recommends, such as a moist heat pack or a heating pad. ? Place a towel between your skin and the heat source. ? Leave the heat on for 20-30 minutes. ? Remove the heat if your skin turns bright red. This is especially important if you are unable to feel pain, heat, or cold. You may have a greater risk of getting burned. Eating and drinking  Drink enough fluid to keep your urine clear or pale yellow.  Resume your normal diet as instructed by your health care provider. Avoid heavy or fried foods that are hard to digest.  Avoid drinking alcohol for as long as instructed by your health care  provider. Contact a health care provider if:  You have blood in your stool 2-3 days after the procedure. Get help right away if:  You have more than a small spotting of blood in your stool.  You pass large blood clots in your stool.  Your abdomen is swollen.  You have nausea or vomiting.  You have a fever.  You have increasing abdominal pain that is not relieved with medicine. This information is not intended to replace advice given to you by your health care provider. Make sure you discuss any questions you have with your health care provider. Document Released: 04/02/2004 Document Revised: 05/13/2016 Document Reviewed: 10/31/2015 Elsevier Interactive Patient Education  [employer].

## 2017-04-01 NOTE — Anesthesia Procedure Notes (Signed)
Date/Time: 04/01/2017 8:06 AM Performed by: [place] Toshiko Kemler C Oxygen Delivery Method: Simple face mask

## 2017-04-03 ENCOUNTER — Encounter (HOSPITAL_COMMUNITY): Payer: Self-pay | Admitting: Gastroenterology

## 2018-10-04 ENCOUNTER — Emergency Department (HOSPITAL_COMMUNITY)
Admission: EM | Admit: 2018-10-04 | Discharge: 2018-10-04 | Disposition: A | Discharge status: Home / Self Care | Payer: Medicare Other | Attending: Emergency Medicine | Admitting: Emergency Medicine

## 2018-10-04 ENCOUNTER — Encounter (HOSPITAL_COMMUNITY): Payer: Self-pay | Admitting: Emergency Medicine

## 2018-10-04 ENCOUNTER — Other Ambulatory Visit: Payer: Self-pay

## 2018-10-04 ENCOUNTER — Emergency Department (HOSPITAL_COMMUNITY): Payer: Medicare Other

## 2018-10-04 DIAGNOSIS — W19XXXA Unspecified fall, initial encounter: Secondary | ICD-10-CM

## 2018-10-04 DIAGNOSIS — S81812A Laceration without foreign body, left lower leg, initial encounter: Secondary | ICD-10-CM | POA: Diagnosis not present

## 2018-10-04 DIAGNOSIS — Y999 Unspecified external cause status: Secondary | ICD-10-CM | POA: Insufficient documentation

## 2018-10-04 DIAGNOSIS — S0990XA Unspecified injury of head, initial encounter: Secondary | ICD-10-CM | POA: Diagnosis present

## 2018-10-04 DIAGNOSIS — Z79899 Other long term (current) drug therapy: Secondary | ICD-10-CM | POA: Insufficient documentation

## 2018-10-04 DIAGNOSIS — Y939 Activity, unspecified: Secondary | ICD-10-CM | POA: Insufficient documentation

## 2018-10-04 DIAGNOSIS — W108XXA Fall (on) (from) other stairs and steps, initial encounter: Secondary | ICD-10-CM | POA: Insufficient documentation

## 2018-10-04 DIAGNOSIS — S5001XA Contusion of right elbow, initial encounter: Secondary | ICD-10-CM | POA: Diagnosis not present

## 2018-10-04 DIAGNOSIS — Z96652 Presence of left artificial knee joint: Secondary | ICD-10-CM | POA: Insufficient documentation

## 2018-10-04 DIAGNOSIS — S0121XA Laceration without foreign body of nose, initial encounter: Secondary | ICD-10-CM | POA: Insufficient documentation

## 2018-10-04 DIAGNOSIS — Z7901 Long term (current) use of anticoagulants: Secondary | ICD-10-CM | POA: Diagnosis not present

## 2018-10-04 DIAGNOSIS — Y92007 Garden or yard of unspecified non-institutional (private) residence as the place of occurrence of the external cause: Secondary | ICD-10-CM | POA: Diagnosis not present

## 2018-10-04 MED ORDER — BACITRACIN ZINC 500 UNIT/GM EX OINT
TOPICAL_OINTMENT | Freq: Once | CUTANEOUS | Status: AC
  Administered 2018-10-04: 1 via TOPICAL
  Filled 2018-10-04: qty 0.9

## 2018-10-04 MED ORDER — LIDOCAINE-EPINEPHRINE (PF) 2 %-1:200000 IJ SOLN
10.0000 mL | Freq: Once | INTRAMUSCULAR | Status: AC
  Administered 2018-10-04: 10 mL
  Filled 2018-10-04: qty 20

## 2018-10-04 NOTE — Discharge Instructions (Addendum)
You were seen in the emergency department for injury from a fall.  You had a small nasal laceration that was closed with skin glue.  You also had a larger laceration on your left leg that was sutured.  There was some tissue injury to that skin and that will need to be watched closely by her doctor to make sure that it heals correctly.  You can use soap and water to your wounds.  Suture removal in 10 to 14 days.  Please return if any worsening symptoms.

## 2018-10-04 NOTE — ED Notes (Signed)
Wound cleansed and wet to dry dressing applied to patients leg by triage RN Joelene Millin.

## 2018-10-04 NOTE — ED Triage Notes (Signed)
Pt slipped on stairs outside and fell injuring LLE, R elbow and nose. Pt with laceration to LLE / calf and pt states he is on blood thinner and wife placed pressure dressing at home. Pt arrived and bleeding controlled. Denies LOC.

## 2018-10-04 NOTE — ED Provider Notes (Addendum)
[facility] DEPT Provider Note   CSN: [id number] Arrival date & time: 10/04/18  8413     History   Chief Complaint Chief Complaint  Patient presents with  . Fall  . Facial Injury  . Extremity Laceration  . Arm Injury    HPI Connor King is a 68 y.o. male.  He has a history of a DVT on Xarelto.  He presents to the emergency department for evaluation from injuries from a fall earlier this morning.  He said he slipped on some wet stairs outside his house and fell to the ground.  There was no loss of consciousness.  He is complaining of a laceration to his left lateral calf along with some abrasions and pain to his bilateral elbows and a laceration over the bridge of his nose.  He denies any headache.  No chest pain abdominal pain numbness weakness neck or back pain.  He has been ambulatory after the incident.  No nausea or vomiting.  Tetanus is up-to-date  The history is provided by the patient and the spouse.  Fall  This is a new problem. The current episode started 1 to 2 hours ago. The problem has been rapidly improving. Pertinent negatives include no chest pain, no abdominal pain, no headaches and no shortness of breath. Associated symptoms comments: LLE, right elbow pain. The symptoms are aggravated by bending. The symptoms are relieved by position. He has tried rest for the symptoms. The treatment provided moderate relief.    Past Medical History:  Diagnosis Date  . Colon cancer ([place])    cancerous polyp was removed  . DVT (deep venous thrombosis) ([place])   . Hiatal hernia   . History of kidney stones   . Obese   . PE (pulmonary embolism) 05/2016   bilateral  . Pre-diabetes   . Sleep apnea    cpap     Patient Active Problem List   Diagnosis Date Noted  . History of DVT (deep vein thrombosis) 05/16/2016  . Morbid obesity with BMI of 40.0-44.9, adult ([place]) 05/16/2016  . OSA (obstructive sleep apnea) 05/16/2016  . Acute hyperglycemia  05/16/2016  . Varicose veins 05/16/2016  . Bilateral pulmonary embolism ([place]) 05/16/2016    Past Surgical History:  Procedure Laterality Date  . CHOLECYSTECTOMY  07/25/2012   Procedure: LAPAROSCOPIC CHOLECYSTECTOMY WITH INTRAOPERATIVE CHOLANGIOGRAM;  Surgeon: Edward Jolly, MD;  Location: [facility];  Service: General;  Laterality: N/A;  . COLON SURGERY    . COLONOSCOPY WITH PROPOFOL N/A 04/01/2017   Procedure: COLONOSCOPY WITH PROPOFOL;  Surgeon: Ronald Lobo, MD;  Location: [facility];  Service: Endoscopy;  Laterality: N/A;  . HERNIA REPAIR    . REPLACEMENT TOTAL KNEE     left knee  . TENDON REPAIR Right 2011        Home Medications    Prior to Admission medications   Medication Sig Start Date End Date Taking? Authorizing Provider  acetaminophen (TYLENOL) 500 MG tablet Take 1,000 mg by mouth every 6 (six) hours as needed for mild pain or headache.    [provider]  cholecalciferol (VITAMIN D) 1000 units tablet Take 1,000 Units by mouth daily.    [provider]  Cyanocobalamin (VITAMIN B 12 PO) Take 1,000 mcg by mouth daily.    [provider]  GAVILYTE-N WITH FLAVOR PACK 420 g solution Take 420 g by mouth See admin instructions. 03/23/17   [provider]  hyoscyamine (ANASPAZ) 0.125 MG TBDP disintergrating tablet Take  0.125 mg by mouth daily as needed for diarrhea or loose stools. 03/18/17   [provider]  rivaroxaban (XARELTO) 20 MG TABS tablet Take 20 mg by mouth daily.    [provider]    Family History No family history on file.  Social History Social History   Tobacco Use  . Smoking status: Never Smoker  . Smokeless tobacco: Never Used  Substance Use Topics  . Alcohol use: No  . Drug use: No     Allergies   Patient has no known allergies.   Review of Systems Review of Systems  Constitutional: Negative for fever.  HENT: Negative for sore throat.   Eyes: Negative for visual disturbance.    Respiratory: Negative for shortness of breath.   Cardiovascular: Negative for chest pain.  Gastrointestinal: Negative for abdominal pain.  Genitourinary: Negative for dysuria.  Musculoskeletal: Negative for neck pain.  Skin: Positive for wound. Negative for rash.  Neurological: Negative for headaches.     Physical Exam Updated Vital Signs BP (!) 152/78 (BP Location: Right Arm)   Pulse 72   Temp 98.9 F (37.2 C) (Oral)   Resp 16   SpO2 98%   Physical Exam Vitals signs and nursing note reviewed.  Constitutional:      Appearance: He is well-developed.  HENT:     Head: Normocephalic.     Comments: He is approximately 0.5 cm laceration over the bridge of his nose with no active bleeding. Eyes:     Conjunctiva/sclera: Conjunctivae normal.  Neck:     Musculoskeletal: Neck supple.  Cardiovascular:     Rate and Rhythm: Normal rate and regular rhythm.     Heart sounds: No murmur.  Pulmonary:     Effort: Pulmonary effort is normal. No respiratory distress.     Breath sounds: Normal breath sounds.  Abdominal:     Palpations: Abdomen is soft.     Tenderness: There is no abdominal tenderness.  Musculoskeletal: Normal range of motion.        General: Signs of injury present.     Right lower leg: Edema present.     Left lower leg: Edema present.     Comments: He has some abrasions over his left elbow with full range of motion.  He has some pain with range of motion of his right elbow although normal landmarks.  He has approximately 3.5 cm V-shaped laceration over his left lateral calf with no obvious foreign bodies.  He is got moderate edema symmetric in both lower extremities.  Full range of motion without any tenderness.  No neck or back pain.  Skin:    General: Skin is warm and dry.     Capillary Refill: Capillary refill takes less than 2 seconds.  Neurological:     General: No focal deficit present.     Mental Status: He is alert and oriented to person, place, and time.       ED Treatments / Results  Labs (all labs ordered are listed, but only abnormal results are displayed) Labs Reviewed - No data to display  EKG None  Radiology Dg Elbow Complete Right  Result Date: 10/04/2018 CLINICAL DATA:  Right elbow pain after fall today. EXAM: RIGHT ELBOW - COMPLETE 3+ VIEW COMPARISON:  None. FINDINGS: There is no evidence of fracture, dislocation, or joint effusion. There is no evidence of arthropathy or other focal bone abnormality. Soft tissues are unremarkable. IMPRESSION: Negative. Electronically Signed   By: Marijo Conception, M.D.  On: 10/04/2018 09:59   Ct Head Wo Contrast  Result Date: 10/04/2018 CLINICAL DATA:  Head injury after fall. EXAM: CT HEAD WITHOUT CONTRAST TECHNIQUE: Contiguous axial images were obtained from the base of the skull through the vertex without intravenous contrast. COMPARISON:  None. FINDINGS: Brain: No evidence of acute infarction, hemorrhage, hydrocephalus, extra-axial collection or mass lesion/mass effect. Vascular: No hyperdense vessel or unexpected calcification. Skull: Normal. Negative for fracture or focal lesion. Sinuses/Orbits: No acute finding. Other: None. IMPRESSION: Normal head CT. Electronically Signed   By: Marijo Conception, M.D.   On: 10/04/2018 09:54    Procedures .Marland KitchenLaceration Repair Date/Time: 10/04/2018 9:24 AM Performed by: Hayden Rasmussen, MD Authorized by: Hayden Rasmussen, MD   Consent:    Consent obtained:  Verbal   Consent given by:  Patient   Risks discussed:  Infection, pain, poor cosmetic result, poor wound healing and retained foreign body   Alternatives discussed:  No treatment and delayed treatment Anesthesia (see MAR for exact dosages):    Anesthesia method:  None Laceration details:    Location:  Face   Face location:  Nose   Length (cm):  0.5 Repair type:    Repair type:  Simple Treatment:    Area cleansed with:  Saline Skin repair:    Repair method:  Tissue adhesive Approximation:     Approximation:  Close Post-procedure details:    Dressing:  Open (no dressing)   Patient tolerance of procedure:  Tolerated well, no immediate complications  .Marland KitchenLaceration Repair Date/Time: 10/04/2018 3:31 PM Performed by: Hayden Rasmussen, MD Authorized by: Hayden Rasmussen, MD   Consent:    Consent obtained:  Verbal   Consent given by:  Patient   Risks discussed:  Infection, pain, poor cosmetic result, poor wound healing and need for additional repair   Alternatives discussed:  No treatment, delayed treatment and referral Anesthesia (see MAR for exact dosages):    Anesthesia method:  Local infiltration   Local anesthetic:  Lidocaine 2% WITH epi Laceration details:    Location:  Leg   Leg location:  L lower leg   Length (cm):  5 Repair type:    Repair type:  Simple Pre-procedure details:    Preparation:  Patient was prepped and draped in usual sterile fashion Exploration:    Contaminated: no   Treatment:    Area cleansed with:  Saline   Amount of cleaning:  Standard Skin repair:    Repair method:  Sutures   Suture size:  4-0   Suture material:  Nylon   Suture technique:  Simple interrupted   Number of sutures:  6 Approximation:    Approximation:  Close Post-procedure details:    Dressing:  Antibiotic ointment and sterile dressing   Patient tolerance of procedure:  Tolerated well, no immediate complications Comments:     Some of the tissue was more of a skin tear and looked a little devitalized.  Attempted to close the area as best as possible without putting too much stress on the wound.  He understands this will need close follow-up with his primary care doctor for continued management.   (including critical care time)  Medications Ordered in ED Medications  lidocaine-EPINEPHrine (XYLOCAINE W/EPI) 2 %-1:200000 (PF) injection 10 mL (has no administration in time range)     Initial Impression / Assessment and Plan / ED Course  I have reviewed the triage vital  signs and the nursing notes.  Pertinent labs & imaging results that were available  during my care of the patient were reviewed by me and considered in my medical decision making (see chart for details).     Final Clinical Impressions(s) / ED Diagnoses   Final diagnoses:  Laceration of nose, initial encounter  Laceration of left lower extremity, initial encounter  Contusion of right elbow, initial encounter  Fall, initial encounter    ED Discharge Orders    None       Hayden Rasmussen, MD 10/04/18 1530    Hayden Rasmussen, MD 10/04/18 [phone]

## 2018-10-04 NOTE — ED Notes (Signed)
Patient transported to X-ray 

## 2018-10-04 NOTE — ED Notes (Signed)
Dermabond, suture tray and cart to bedside. Melina Copa, MD at bedside.

## 2019-04-20 ENCOUNTER — Other Ambulatory Visit: Payer: Self-pay | Admitting: Urology

## 2019-04-26 ENCOUNTER — Encounter (HOSPITAL_COMMUNITY): Payer: Self-pay | Admitting: General Practice

## 2019-04-26 ENCOUNTER — Ambulatory Visit (HOSPITAL_COMMUNITY)
Admission: RE | Admit: 2019-04-26 | Discharge: 2019-04-26 | Disposition: A | Discharge status: Home / Self Care | Payer: Medicare Other | Source: Other Acute Inpatient Hospital | Attending: Urology | Admitting: Urology

## 2019-04-26 ENCOUNTER — Encounter (HOSPITAL_COMMUNITY)
Admission: RE | Disposition: A | Discharge status: Home / Self Care | Payer: Self-pay | Source: Other Acute Inpatient Hospital | Attending: Urology

## 2019-04-26 ENCOUNTER — Ambulatory Visit (HOSPITAL_COMMUNITY): Payer: Medicare Other

## 2019-04-26 DIAGNOSIS — E119 Type 2 diabetes mellitus without complications: Secondary | ICD-10-CM | POA: Insufficient documentation

## 2019-04-26 DIAGNOSIS — Z86711 Personal history of pulmonary embolism: Secondary | ICD-10-CM | POA: Insufficient documentation

## 2019-04-26 DIAGNOSIS — Z7901 Long term (current) use of anticoagulants: Secondary | ICD-10-CM | POA: Insufficient documentation

## 2019-04-26 DIAGNOSIS — N132 Hydronephrosis with renal and ureteral calculous obstruction: Secondary | ICD-10-CM | POA: Insufficient documentation

## 2019-04-26 DIAGNOSIS — G4730 Sleep apnea, unspecified: Secondary | ICD-10-CM | POA: Diagnosis not present

## 2019-04-26 DIAGNOSIS — Z86718 Personal history of other venous thrombosis and embolism: Secondary | ICD-10-CM | POA: Diagnosis not present

## 2019-04-26 DIAGNOSIS — N201 Calculus of ureter: Secondary | ICD-10-CM

## 2019-04-26 HISTORY — PX: EXTRACORPOREAL SHOCK WAVE LITHOTRIPSY: SHX1557

## 2019-04-26 LAB — GLUCOSE, CAPILLARY: Glucose-Capillary: 166 mg/dL — ABNORMAL HIGH (ref 70–99)

## 2019-04-26 SURGERY — LITHOTRIPSY, ESWL
Anesthesia: LOCAL | Laterality: Left

## 2019-04-26 MED ORDER — OXYCODONE-ACETAMINOPHEN 5-325 MG PO TABS: 1.0000 | ORAL_TABLET | ORAL | 0 refills | Status: DC | PRN

## 2019-04-26 MED ORDER — TAMSULOSIN HCL 0.4 MG PO CAPS: 0.4000 mg | ORAL_CAPSULE | Freq: Every day | ORAL | 0 refills | Status: DC

## 2019-04-26 MED ORDER — CIPROFLOXACIN HCL 500 MG PO TABS
500.0000 mg | ORAL_TABLET | ORAL | Status: AC
  Administered 2019-04-26: 07:00:00 500 mg via ORAL
  Filled 2019-04-26: qty 1

## 2019-04-26 MED ORDER — DIPHENHYDRAMINE HCL 25 MG PO CAPS
25.0000 mg | ORAL_CAPSULE | ORAL | Status: AC
  Administered 2019-04-26: 25 mg via ORAL
  Filled 2019-04-26: qty 1

## 2019-04-26 MED ORDER — DIAZEPAM 5 MG PO TABS
10.0000 mg | ORAL_TABLET | ORAL | Status: AC
  Administered 2019-04-26: 10 mg via ORAL
  Filled 2019-04-26: qty 2

## 2019-04-26 MED ORDER — SODIUM CHLORIDE 0.9 % IV SOLN
INTRAVENOUS | Status: DC
  Administered 2019-04-26: 07:00:00 via INTRAVENOUS

## 2019-04-26 NOTE — Progress Notes (Signed)
Called Dr Lovena Neighbours re prescription at pt's request. He will re submit prescription to a different pharmacy.

## 2019-04-26 NOTE — Op Note (Signed)
[facility] Operative Note  Treating Physician: Christopher Winter, MD  Pre-op diagnosis: 7 mm left mid-ureteral stone  Post-op diagnosis: Same   Procedure: LEFT ESWL   See [facility] OP note scanned into chart. Also because of the size, density, location and other factors that cannot be anticipated I feel this will likely be a staged procedure. This fact supersedes any indication in the scanned [facility] operative note to the contrary   

## 2019-04-26 NOTE — H&P (Signed)
PRE-OP H&P   CC:I have blood in my urine.  HPI: Connor King is a 68 year-old male established patient who is here for blood in the urine.  He did see the blood in his urine. The blood was first detected 06/12/2017. He has been told that they had blood in the urine in the past.   He does not have a history of smoking. He does not have a history of exposure to chemicals or fumes. He does not have a history of urinary infections. He does not have a burning sensation when he urinates. There is not a history of GU malignancy in the family. There is not a history of calculus disease in the family. He is not having pain. He has not recently had unwanted weight loss.   His last U/S or CT Scan was 04/06/2019. This condition would be considered of mild to moderate severity with no modifying factors or associated signs or symptoms other than as noted above.    03/30/19: He recently experienced an episode of gross hematuria. His urine was 3+ positive by dipstick on 03/24/19 and a negative urine culture. Gross hematuria occurred approximately 2 weeks ago. Since then he has been having some discomfort in his left flank. He said it comes and goes. He said it is not severe rating it 3/10. He has not seen any further hematuria. He has not had any change in his voiding pattern. He had a known left renal calculus by CT scan 2 years ago.   04/13/19: He returns today for completion of his workup of microscopic hematuria. He reports he is not experiencing any flank pain nor has he seen any gross hematuria.     ALLERGIES: None   MEDICATIONS: Tamsulosin Hcl 0.4 mg capsule 1 capsule PO Q PM  Acetaminophen  Hyoscyamine Sulfate  Vitamin B 12  Vitamin D3  Xarelto 20 mg tablet     GU PSH: Cystoscopy - 08/12/2017 Locm 300-399Mg /Ml Iodine,1Ml - 08/07/2017     NON-GU PSH: Cholecystectomy (laparoscopic) Colonoscopy & Polypectomy Hernia Repair Knee Arthroscopy/surgery, Right Knee replacement, Left     GU PMH:  Gross hematuria, His gross hematuria appears to be secondary to a possible stone in his left ureter worsened by the fact that he is on Xarelto. - 03/30/2019 Ureteral calculus, Left, He appears to possibly have a left proximal ureteral stone. He is not being bothered by particularly but he has some any densities in his abdomen and his body habitus makes it a little difficult to determine so if he does not see a stone pass when he returns I will obtain a CT scan. In the meantime I have placed him on medical expulsive therapy with tamsulosin. - 03/30/2019 Microscopic hematuria (Stable), I suspect his microscopic hematuria is from the stone lower pole of his left kidney and the fact that he is on Xarelto. No other abnormality of the upper tract or on cystoscopy was noted today. I have reassured the patient of this. - 08/12/2017, He does not have any significant risk factors for urothelial malignancy. He does take Xarelto and we discussed the fact that this could potentially have unmasked something minor. I therefore have recommended we proceed with a full hematuria workup., - 07/17/2017 Renal calculus, Bilateral, We discussed the fact that she has a punctate stone in the right kidney and no clinical significance and a 7 mm stone in the lower pole of her left kidney which is not causing obstruction at this time. I have recommended we  monitor these with a KUB again in 6 months to assure stability. - 08/12/2017    NON-GU PMH: Diabetes Type 2 DVT, History Malignant neoplasm of colon, unspecified Pulmonary Embolism, History Sleep Apnea    FAMILY HISTORY: 1 Daughter - Runs in Family 1 son - Runs in Family Lung Cancer - Father, Mother   SOCIAL HISTORY: Marital Status: Married Preferred Language: English; Ethnicity: Not Hispanic Or Latino; Race: White Current Smoking Status: Patient has never smoked.   Tobacco Use Assessment Completed: Used Tobacco in last 30 days? Does not drink anymore.  Drinks 4+  caffeinated drinks per day.    REVIEW OF SYSTEMS:    GU Review Male:   Patient denies frequent urination, hard to postpone urination, burning/ pain with urination, get up at night to urinate, leakage of urine, stream starts and stops, trouble starting your stream, have to strain to urinate , erection problems, and penile pain.  Gastrointestinal (Upper):   Patient denies nausea, vomiting, and indigestion/ heartburn.  Gastrointestinal (Lower):   Patient denies diarrhea and constipation.  Constitutional:   Patient denies fever, night sweats, weight loss, and fatigue.  Skin:   Patient denies skin rash/ lesion and itching.  Eyes:   Patient denies blurred vision and double vision.  Ears/ Nose/ Throat:   Patient denies sore throat and sinus problems.  Hematologic/Lymphatic:   Patient denies swollen glands and easy bruising.  Cardiovascular:   Patient denies leg swelling and chest pains.  Respiratory:   Patient denies cough and shortness of breath.  Endocrine:   Patient denies excessive thirst.  Musculoskeletal:   Patient denies back pain and joint pain.  Neurological:   Patient denies headaches and dizziness.  Psychologic:   Patient denies depression and anxiety.   VITAL SIGNS: None   GU PHYSICAL EXAMINATION:    Urethral Meatus: Normal size. No lesion, no wart, no discharge, no polyp. Normal location.  Penis: Circumcised, no warts, no cracks. No dorsal Peyronie's plaques, no left corporal Peyronie's plaques, no right corporal Peyronie's plaques, no scarring, no warts. No balanitis, no meatal stenosis.   MULTI-SYSTEM PHYSICAL EXAMINATION:       PAST DATA REVIEWED:  Source Of History:  Patient  Records Review:   Previous Patient Records  X-Ray Review: C.T. Abdomen/Pelvis: Reviewed Films. Reviewed Report. Discussed With Patient. EXAM: CT ABDOMEN AND PELVIS WITHOUT CONTRAST TECHNIQUE: Multidetector CT imaging of the abdomen and pelvis was performed following the standard protocol without IV  contrast. COMPARISON: 08/07/2017 FINDINGS: Lower chest: No acute abnormality. Hepatobiliary: Previous cholecystectomy. No biliary dilatation. No focal liver abnormality. Pancreas: Unremarkable. No pancreatic ductal dilatation or surrounding inflammatory changes. Spleen: Normal in size without focal abnormality. Adrenals/Urinary Tract: Normal appearance of the adrenal glands. The right kidney is unremarkable. No right sided nephrolithiasis or hydronephrosis. Left-sided hydronephrosis is identified. There is a stone within the proximal left ureter which measures 7.6 mm in length with a diameter of 6.3 mm. Bladder normal. Stomach/Bowel: Stomach is within normal limits. Appendix not confidently identified on today's exam. No evidence of bowel wall thickening, distention, or inflammatory changes. Postoperative changes involving right lower quadrant small bowel loops noted. Left-sided colonic diverticula. Vascular/Lymphatic: Aortic atherosclerosis. No aneurysm. No abdominopelvic adenopathy. Reproductive: Prostate is unremarkable. Other: No abdominal wall hernia or abnormality. No abdominopelvic ascites. Previous ventral abdominal wall herniorrhaphy. Musculoskeletal: No acute or suspicious osseous findings. Multi level lumbar degenerative disc disease. IMPRESSION: 1. Left-sided hydronephrosis secondary to a proximal left ureteral calculus. 2. Aortic Atherosclerosis    03/24/19 02/28/17  PSA  Total PSA 0.59 ng/dl 0.62 ng/dl    PROCEDURES:         Flexible Cystoscopy - 52000  Risks, benefits, and some of the potential complications were discussed. Sterile technique and 2% Lidocaine intraurethral analgesia were used.  Meatus:  Normal size. Normal location. Normal condition.  Urethra:  No strictures.  External Sphincter:  Normal.  Verumontanum:  Normal.  Prostate:  Borderline obstructing. Moderate hyperplasia.  Bladder Neck:  Non-obstructing.  Ureteral Orifices:  Normal location. Normal size. Normal shape.  Effluxed clear urine.  Bladder:  Mild trabeculation. No tumors. Normal mucosa. No stones.      The lower urinary tract was carefully examined. The procedure was well-tolerated and without complications. Instructions were given to call the office immediately for bloody urine, difficulty urinating, urinary retention, painful or frequent urination, fever or other illness. The patient stated that he understood these instructions and would comply with them.         Urinalysis Dipstick Dipstick Cont'd  Color: Yellow Bilirubin: Neg mg/dL  Appearance: Clear Ketones: Neg mg/dL  Specific Gravity: 1.025 Blood: Neg ery/uL  pH: <=5.0 Protein: Neg mg/dL  Glucose: Neg mg/dL Urobilinogen: 0.2 mg/dL    Nitrites: Neg    Leukocyte Esterase: Neg leu/uL    ASSESSMENT:      ICD-10 Details  1 GU:   Microscopic hematuria - R31.21 Stable - He has microscopic hematuria from a stone in his left ureter.  2   Ureteral calculus - N20.1 Left, He was found to have a left ureteral calculus with associated hydronephrosis and no renal calculi.  3   Ureteral obstruction secondary to calculous - N13.2 Left, He has only mild left hydronephrosis.          Notes:   We discussed the management of urinary stones. These options include observation, ureteroscopy, shockwave lithotripsy, and PCNL. We discussed which options are relevant to these particular stones. We discussed the natural history of stones as well as the complications of untreated stones and the impact on quality of life without treatment as well as with each of the above listed treatments. We also discussed the efficacy of each treatment in its ability to clear the stone burden. With any of these management options I discussed the signs and symptoms of infection and the need for emergent treatment should these be experienced. For each option we discussed the ability of each procedure to clear the patient of their stone burden.   For observation I described the risks  which include but are not limited to silent renal damage, life-threatening infection, need for emergent surgery, failure to pass stone, and pain.   For ureteroscopy I described the risks which include heart attack, stroke, pulmonary embolus, death, bleeding, infection, damage to contiguous structures, positioning injury, ureteral stricture, ureteral avulsion, ureteral injury, need for ureteral stent, inability to perform ureteroscopy, need for an interval procedure, inability to clear stone burden, stent discomfort and pain.   For shockwave lithotripsy I described the risks which include arrhythmia, kidney contusion, kidney hemorrhage, need for transfusion, long-term risk of diabetes or hypertension, back discomfort, flank ecchymosis, flank abrasion, inability to break up stone, inability to pass stone fragments, Steinstrasse, infection associated with obstructing stones, need for different surgical procedure, need for repeat shockwave lithotripsy, and death.    He has had a previous lithotripsy about 30 years ago. He indicated that of the options we discussed he would like to proceed with lithotripsy and therefore will be scheduled for that.   The location  of the stone has been marked on the plain film reconstruction of his CT scan.

## 2019-04-27 ENCOUNTER — Encounter (HOSPITAL_COMMUNITY): Payer: Self-pay | Admitting: Urology

## 2019-06-18 ENCOUNTER — Other Ambulatory Visit: Payer: Self-pay | Admitting: Urology

## 2019-06-22 ENCOUNTER — Other Ambulatory Visit (HOSPITAL_COMMUNITY)
Admission: RE | Admit: 2019-06-22 | Discharge: 2019-06-22 | Disposition: A | Discharge status: Home / Self Care | Payer: Medicare Other | Source: Ambulatory Visit | Attending: Urology | Admitting: Urology

## 2019-06-22 DIAGNOSIS — Z20828 Contact with and (suspected) exposure to other viral communicable diseases: Secondary | ICD-10-CM | POA: Diagnosis not present

## 2019-06-22 DIAGNOSIS — Z01812 Encounter for preprocedural laboratory examination: Secondary | ICD-10-CM | POA: Insufficient documentation

## 2019-06-24 ENCOUNTER — Encounter (HOSPITAL_BASED_OUTPATIENT_CLINIC_OR_DEPARTMENT_OTHER): Payer: Self-pay | Admitting: *Deleted

## 2019-06-24 ENCOUNTER — Other Ambulatory Visit: Payer: Self-pay

## 2019-06-24 LAB — NOVEL CORONAVIRUS, NAA (HOSP ORDER, SEND-OUT TO REF LAB; TAT 18-24 HRS): SARS-CoV-2, NAA: NOT DETECTED

## 2019-06-24 NOTE — Progress Notes (Addendum)
Spoke w/ via phone for pre-op interview--- PT Lab needs dos----  Istat 8 and EKG COVID test ------ 06-22-2019 Arrive at ------- 0730 NPO after ------ [place] Medications to take morning of surgery ----- Lipitor Diabetic medication ----- n/a Patient Special Instructions ----- asked to bring cpap mask/ tubing with him dos  Pre-Op special Istructions ----- spoke w/ coni, or scheduler for dr Karsten Ro, via phone stated pt was given clearance to stop xarelto from dr Jenny Reichmann griffin.  Coni to fax it.  Patient verbalized understanding of instructions that were given at this phone interview. Patient denies shortness of breath, chest pain, fever, cough a this phone interview.   Anesthesia:  PCP:  Dr Lavone Orn Cardiologist : no Chest x-ray : CT angio 05-16-2016 epic EKG : 05-16-2016 epic Echo : 09-16-217 epic Cardiac Cath :  no Sleep Study/ CPAP : Yes/ Yes Fasting Blood Sugar :    Pt does not check     Blood Thinner/ Instructions /Last Dose: Xarelto/  Pt given instructions to stop 3 days prior to surgery from dr Karsten Ro office whom was given clearance from pt's pcp/  Last dose 06-21-2019  ASA / Instructions/ Last Dose :  NO

## 2019-06-24 NOTE — H&P (Signed)
HPI: Connor King is a 68 year-old male with persistant left ureteral calculi.  03/30/19: He recently experienced an episode of gross hematuria. His urine was 3+ positive by dipstick on 03/24/19 and a negative urine culture. Gross hematuria occurred approximately 2 weeks ago. Since then he has been having some discomfort in his left flank. He said it comes and goes. He said it is not severe rating it 3/10. He has not seen any further hematuria. He has not had any change in his voiding pattern. He had a known left renal calculus by CT scan 2 years ago.   04/13/19: He returns today for completion of his workup of microscopic hematuria. He reports he is not experiencing any flank pain nor has he seen any gross hematuria.   05/11/2019: F/u today after ESWL for a left proximal ureteral calculus which was determined to be the source of his previously noted microscopic hematuria. He has passed several small stone fragments in the interval since his procedure. Since then he's had resolution of any pain/discomfort and endorses returned to baseline voiding symptoms. No recent dysuria or gross hematuria. Denies increased frequency/urgency or intermittency of urine stream. He initially had dizziness with tamsulosin use and was recommended to discontinue the medication. Today he states he continues to take the medicine but has not had any additional side effects.   06/10/2019: He returns today for repeat evaluation and KUB. Imaging studies performed at last office visit noted stone had fragmented But Not Completely Cleared the left proximal Ureter. He was not having any new pain/discomfort or bothersome lower urinary tract symptoms at that time. I continued him on tamsulosin.   He has poorly tolerated tamsulosin in the interval experiencing increased fatigue and occasional dizziness with position changes. He denies interval stones material passage. He's had no recurrence of left-sided pain/discomfort. No worsening lower  urinary tract symptoms, he denies dysuria, increased urgency, change in force of stream, visible blood in the urine. Denies interval fevers or chills, nausea/vomiting.   The stone was on the left side. He had ESWL for treatment of his renal calculi. Patient denies Stent, Ureteroscopy, and PCNL. This procedure was done 04/26/2019. He did not pass a stone since the last office visit. This is not his first kidney stone. He does not have a stent in place.   He is not currently having flank pain, back pain, groin pain, nausea, vomiting, fever or chills.   He does not have dysuria. He does not have urgency. He does not have frequency.     ALLERGIES: None   MEDICATIONS: Lipitor  Tamsulosin Hcl 0.4 mg capsule TAKE 1 CAPSULE BY MOUTH EVERY DAY  Acetaminophen  Vitamin B 12  Vitamin D3  Xarelto 20 mg tablet     GU PSH: Cystoscopy - 04/13/2019, 08/12/2017 ESWL, Left - 04/26/2019 Locm 300-399Mg /Ml Iodine,1Ml - 08/07/2017     NON-GU PSH: Cholecystectomy (laparoscopic) Colonoscopy & Polypectomy Hernia Repair Knee Arthroscopy/surgery, Right Knee replacement, Left     GU PMH: Microscopic hematuria (Stable), He has microscopic hematuria from a stone in his left ureter. - 04/13/2019, (Stable), I suspect his microscopic hematuria is from the stone lower pole of his left kidney and the fact that he is on Xarelto. No other abnormality of the upper tract or on cystoscopy was noted today. I have reassured the patient of this., - 08/12/2017, He does not have any significant risk factors for urothelial malignancy. He does take Xarelto and we discussed the fact that this could potentially have unmasked  something minor. I therefore have recommended we proceed with a full hematuria workup., - 07/17/2017 Ureteral obstruction secondary to calculous, Left, He has only mild left hydronephrosis. - 04/13/2019 Gross hematuria, His gross hematuria appears to be secondary to a possible stone in his left ureter worsened by  the fact that he is on Xarelto. - 03/30/2019 Ureteral calculus, Left, He appears to possibly have a left proximal ureteral stone. He is not being bothered by particularly but he has some any densities in his abdomen and his body habitus makes it a little difficult to determine so if he does not see a stone pass when he returns I will obtain a CT scan. In the meantime I have placed him on medical expulsive therapy with tamsulosin. - 03/30/2019 Renal calculus, Bilateral, We discussed the fact that she has a punctate stone in the right kidney and no clinical significance and a 7 mm stone in the lower pole of her left kidney which is not causing obstruction at this time. I have recommended we monitor these with a KUB again in 6 months to assure stability. - 08/12/2017    NON-GU PMH: Diabetes Type 2 DVT, History Malignant neoplasm of colon, unspecified Pulmonary Embolism, History Sleep Apnea    FAMILY HISTORY: 1 Daughter - Runs in Family 1 son - Runs in Family Lung Cancer - Father, Mother   SOCIAL HISTORY: Marital Status: Married Preferred Language: English; Ethnicity: Not Hispanic Or Latino; Race: White Current Smoking Status: Patient has never smoked.   Tobacco Use Assessment Completed: Used Tobacco in last 30 days? Does not drink anymore.  Drinks 4+ caffeinated drinks per day.    REVIEW OF SYSTEMS:    GU Review Male:   Patient denies frequent urination, hard to postpone urination, burning/ pain with urination, get up at night to urinate, leakage of urine, stream starts and stops, trouble starting your stream, have to strain to urinate , erection problems, and penile pain.  Gastrointestinal (Upper):   Patient denies nausea, vomiting, and indigestion/ heartburn.  Gastrointestinal (Lower):   Patient denies diarrhea and constipation.  Constitutional:   Patient denies fever, night sweats, weight loss, and fatigue.  Skin:   Patient denies skin rash/ lesion and itching.  Eyes:   Patient denies  blurred vision and double vision.  Ears/ Nose/ Throat:   Patient denies sore throat and sinus problems.  Hematologic/Lymphatic:   Patient denies swollen glands and easy bruising.  Cardiovascular:   Patient denies leg swelling and chest pains.  Respiratory:   Patient denies cough and shortness of breath.  Endocrine:   Patient denies excessive thirst.  Musculoskeletal:   Patient denies back pain and joint pain.  Neurological:   Patient denies headaches and dizziness.  Psychologic:   Patient denies depression and anxiety.   Notes: follow up     VITAL SIGNS:    Weight 319.6 lb / 144.97 kg  Height 74 in / 187.96 cm  BP 133/81 mmHg  Pulse 75 /min  Temperature 98.0 F / 36.6 C  BMI 41.0 kg/m   MULTI-SYSTEM PHYSICAL EXAMINATION:    Constitutional: Well-nourished. No physical deformities. Normally developed. Good grooming.  Neck: Neck symmetrical, not swollen. Normal tracheal position.  Respiratory: No labored breathing, no use of accessory muscles.   Cardiovascular: Normal temperature, normal extremity pulses, no swelling, no varicosities.  Neurologic / Psychiatric: Oriented to time, oriented to place, oriented to person. No depression, no anxiety, no agitation.  Gastrointestinal: Obese abdomen. No mass, no tenderness, no rigidity.  Musculoskeletal: Normal gait and station of head and neck.     PAST DATA REVIEWED:  Source Of History:  Patient, Medical Record Summary  Records Review:   Previous Hospital Records, Previous Patient Records  Urine Test Review:   Urinalysis  X-Ray Review: KUB: Reviewed Films. Discussed With Patient.     03/24/19 02/28/17  PSA  Total PSA 0.59 ng/dl 0.62 ng/dl    PROCEDURES:         KUB - 74018  A single view of the abdomen is obtained. Visualizing the anatomical expected tract of the left proximal ureter, 2 stone fragments are identified overlying the L4 transverse process. Minimal distal progression noted compared to previous imaging study. No  additional ureteral calculi identified on today's exam.            Urinalysis w/Scope Dipstick Dipstick Cont'd Micro  Color: Yellow Bilirubin: Neg mg/dL WBC/hpf: 0 - 5/hpf  Appearance: Clear Ketones: Neg mg/dL RBC/hpf: 3 - 10/hpf  Specific Gravity: 1.025 Blood: Trace ery/uL Bacteria: Rare (0-9/hpf)  pH: <=5.0 Protein: Neg mg/dL Cystals: Ca Oxalate  Glucose: Neg mg/dL Urobilinogen: 0.2 mg/dL Casts: NS (Not Seen)    Nitrites: Neg Trichomonas: Not Present    Leukocyte Esterase: Neg leu/uL Mucous: Not Present      Epithelial Cells: NS (Not Seen)      Yeast: NS (Not Seen)      Sperm: Not Present    ASSESSMENT/PLAN:       ICD-10 Details  1 GU:   Ureteral calculus - Left        One and likely two stone fragments remain overlying the L4 transverse process in the left proximal ureter.  Patient will need repeat intervention. Given the stone failed to clear the proximal ureter and there are 2 distinct stone fragments adjacent to one another on imaging study he likely would benefit from more definitive intervention in the form of ureteroscopy.  Patient grossly remains asymptomatic but is not tolerating tamsulosin well with increased fatigue and intermittent dizziness/lightheadedness with especially with position changes. I told him he could trial coming off the medication at this time to see if that helps improve his constitutional symptoms but if he does experiencing worsening pain/discomfort and increased lower urinary tract symptoms and advised to resume the medication.   Patient agreeable with proceeding with ureteroscopy. I described the risks which include heart attack, stroke, pulmonary embolus, death, bleeding, infection, damage to contiguous structures, positioning injury, ureteral stricture, ureteral avulsion, ureteral injury, need for ureteral stent, inability to perform ureteroscopy, need for an interval procedure, inability to clear stone burden, stent discomfort and pain.

## 2019-06-25 ENCOUNTER — Ambulatory Visit (HOSPITAL_BASED_OUTPATIENT_CLINIC_OR_DEPARTMENT_OTHER): Payer: Medicare Other | Admitting: Anesthesiology

## 2019-06-25 ENCOUNTER — Other Ambulatory Visit: Payer: Self-pay

## 2019-06-25 ENCOUNTER — Ambulatory Visit (HOSPITAL_BASED_OUTPATIENT_CLINIC_OR_DEPARTMENT_OTHER)
Admission: RE | Admit: 2019-06-25 | Discharge: 2019-06-25 | Disposition: A | Discharge status: Home / Self Care | Payer: Medicare Other | Attending: Urology | Admitting: Urology

## 2019-06-25 ENCOUNTER — Encounter (HOSPITAL_BASED_OUTPATIENT_CLINIC_OR_DEPARTMENT_OTHER)
Admission: RE | Disposition: A | Discharge status: Home / Self Care | Payer: Self-pay | Source: Home / Self Care | Attending: Urology

## 2019-06-25 DIAGNOSIS — Z6841 Body Mass Index (BMI) 40.0 and over, adult: Secondary | ICD-10-CM | POA: Diagnosis not present

## 2019-06-25 DIAGNOSIS — E119 Type 2 diabetes mellitus without complications: Secondary | ICD-10-CM | POA: Insufficient documentation

## 2019-06-25 DIAGNOSIS — Z86711 Personal history of pulmonary embolism: Secondary | ICD-10-CM | POA: Diagnosis not present

## 2019-06-25 DIAGNOSIS — Z79899 Other long term (current) drug therapy: Secondary | ICD-10-CM | POA: Insufficient documentation

## 2019-06-25 DIAGNOSIS — Z86718 Personal history of other venous thrombosis and embolism: Secondary | ICD-10-CM | POA: Insufficient documentation

## 2019-06-25 DIAGNOSIS — N201 Calculus of ureter: Secondary | ICD-10-CM

## 2019-06-25 DIAGNOSIS — Z7901 Long term (current) use of anticoagulants: Secondary | ICD-10-CM | POA: Diagnosis not present

## 2019-06-25 DIAGNOSIS — G4730 Sleep apnea, unspecified: Secondary | ICD-10-CM | POA: Insufficient documentation

## 2019-06-25 DIAGNOSIS — Z85038 Personal history of other malignant neoplasm of large intestine: Secondary | ICD-10-CM | POA: Insufficient documentation

## 2019-06-25 DIAGNOSIS — Z87442 Personal history of urinary calculi: Secondary | ICD-10-CM | POA: Diagnosis not present

## 2019-06-25 DIAGNOSIS — Z96652 Presence of left artificial knee joint: Secondary | ICD-10-CM | POA: Insufficient documentation

## 2019-06-25 HISTORY — DX: Atrioventricular block, first degree: I44.0

## 2019-06-25 HISTORY — DX: Personal history of other venous thrombosis and embolism: Z86.718

## 2019-06-25 HISTORY — PX: CYSTOSCOPY/RETROGRADE/URETEROSCOPY/STONE EXTRACTION WITH BASKET: SHX5317

## 2019-06-25 HISTORY — DX: Hyperlipidemia, unspecified: E78.5

## 2019-06-25 HISTORY — DX: Personal history of other diseases of the digestive system: Z87.19

## 2019-06-25 HISTORY — DX: Calculus of ureter: N20.1

## 2019-06-25 HISTORY — DX: Obstructive sleep apnea (adult) (pediatric): G47.33

## 2019-06-25 HISTORY — DX: Personal history of pulmonary embolism: Z86.711

## 2019-06-25 HISTORY — DX: Personal history of other malignant neoplasm of large intestine: Z85.038

## 2019-06-25 HISTORY — DX: Long term (current) use of anticoagulants: Z79.01

## 2019-06-25 HISTORY — DX: Type 2 diabetes mellitus without complications: E11.9

## 2019-06-25 LAB — POCT I-STAT, CHEM 8
BUN: 26 mg/dL — ABNORMAL HIGH (ref 8–23)
Calcium, Ion: 1.24 mmol/L (ref 1.15–1.40)
Chloride: 101 mmol/L (ref 98–111)
Creatinine, Ser: 1.5 mg/dL — ABNORMAL HIGH (ref 0.61–1.24)
Glucose, Bld: 143 mg/dL — ABNORMAL HIGH (ref 70–99)
HCT: 41 % (ref 39.0–52.0)
Hemoglobin: 13.9 g/dL (ref 13.0–17.0)
Potassium: 3.8 mmol/L (ref 3.5–5.1)
Sodium: 141 mmol/L (ref 135–145)
TCO2: 28 mmol/L (ref 22–32)

## 2019-06-25 LAB — GLUCOSE, CAPILLARY: Glucose-Capillary: 133 mg/dL — ABNORMAL HIGH (ref 70–99)

## 2019-06-25 SURGERY — CYSTOSCOPY, WITH CALCULUS REMOVAL USING BASKET
Anesthesia: General | Site: Renal | Laterality: Left

## 2019-06-25 MED ORDER — ONDANSETRON HCL 4 MG/2ML IJ SOLN
INTRAMUSCULAR | Status: DC | PRN
  Administered 2019-06-25: 4 mg via INTRAVENOUS

## 2019-06-25 MED ORDER — IOHEXOL 300 MG/ML  SOLN
INTRAMUSCULAR | Status: DC | PRN
  Administered 2019-06-25: 10 mL

## 2019-06-25 MED ORDER — ONDANSETRON HCL 4 MG/2ML IJ SOLN
INTRAMUSCULAR | Status: AC
  Filled 2019-06-25: qty 2

## 2019-06-25 MED ORDER — DEXAMETHASONE SODIUM PHOSPHATE 10 MG/ML IJ SOLN
INTRAMUSCULAR | Status: AC
  Filled 2019-06-25: qty 1

## 2019-06-25 MED ORDER — MIDAZOLAM HCL 5 MG/5ML IJ SOLN
INTRAMUSCULAR | Status: DC | PRN
  Administered 2019-06-25: 2 mg via INTRAVENOUS

## 2019-06-25 MED ORDER — LIDOCAINE 2% (20 MG/ML) 5 ML SYRINGE
INTRAMUSCULAR | Status: AC
  Filled 2019-06-25: qty 5

## 2019-06-25 MED ORDER — PROPOFOL 10 MG/ML IV BOLUS
INTRAVENOUS | Status: DC | PRN
  Administered 2019-06-25: 30 mg via INTRAVENOUS
  Administered 2019-06-25: 200 mg via INTRAVENOUS

## 2019-06-25 MED ORDER — CEFAZOLIN SODIUM-DEXTROSE 2-4 GM/100ML-% IV SOLN
INTRAVENOUS | Status: AC
  Filled 2019-06-25: qty 100

## 2019-06-25 MED ORDER — DEXAMETHASONE SODIUM PHOSPHATE 4 MG/ML IJ SOLN
INTRAMUSCULAR | Status: DC | PRN
  Administered 2019-06-25: 5 mg via INTRAVENOUS

## 2019-06-25 MED ORDER — PROPOFOL 10 MG/ML IV BOLUS
INTRAVENOUS | Status: AC
  Filled 2019-06-25: qty 20

## 2019-06-25 MED ORDER — FENTANYL CITRATE (PF) 100 MCG/2ML IJ SOLN
INTRAMUSCULAR | Status: AC
  Filled 2019-06-25: qty 2

## 2019-06-25 MED ORDER — PHENAZOPYRIDINE HCL 200 MG PO TABS: 200.0000 mg | ORAL_TABLET | Freq: Three times a day (TID) | ORAL | 0 refills | Status: DC | PRN

## 2019-06-25 MED ORDER — LACTATED RINGERS IV SOLN
INTRAVENOUS | Status: DC
  Administered 2019-06-25: 08:00:00 via INTRAVENOUS
  Filled 2019-06-25: qty 1000

## 2019-06-25 MED ORDER — SODIUM CHLORIDE 0.9 % IR SOLN
Status: DC | PRN
  Administered 2019-06-25: 3000 mL

## 2019-06-25 MED ORDER — CEFAZOLIN SODIUM-DEXTROSE 2-4 GM/100ML-% IV SOLN
2.0000 g | Freq: Once | INTRAVENOUS | Status: AC
  Administered 2019-06-25: 2 g via INTRAVENOUS
  Filled 2019-06-25: qty 100

## 2019-06-25 MED ORDER — LIDOCAINE HCL (CARDIAC) PF 100 MG/5ML IV SOSY
PREFILLED_SYRINGE | INTRAVENOUS | Status: DC | PRN
  Administered 2019-06-25: 80 mg via INTRAVENOUS

## 2019-06-25 MED ORDER — FENTANYL CITRATE (PF) 100 MCG/2ML IJ SOLN
INTRAMUSCULAR | Status: DC | PRN
  Administered 2019-06-25 (×2): 50 ug via INTRAVENOUS

## 2019-06-25 MED ORDER — MIDAZOLAM HCL 2 MG/2ML IJ SOLN
INTRAMUSCULAR | Status: AC
  Filled 2019-06-25: qty 2

## 2019-06-25 MED ORDER — HYDROCODONE-ACETAMINOPHEN 10-325 MG PO TABS: 1.0000 | ORAL_TABLET | ORAL | 0 refills | Status: DC | PRN

## 2019-06-25 SURGICAL SUPPLY — 31 items
BAG DRAIN URO-CYSTO SKYTR STRL (DRAIN) ×2 IMPLANT
BAG DRN UROCATH (DRAIN) ×1
BASKET ZERO TIP NITINOL 2.4FR (BASKET) ×1 IMPLANT
BSKT STON RTRVL ZERO TP 2.4FR (BASKET) ×1
CATH INTERMIT  6FR 70CM (CATHETERS) IMPLANT
CATH URET 5FR 28IN CONE TIP (BALLOONS)
CATH URET 5FR 70CM CONE TIP (BALLOONS) IMPLANT
CLOTH BEACON ORANGE TIMEOUT ST (SAFETY) ×2 IMPLANT
EXTRACTOR STONE 1.7FRX115CM (UROLOGICAL SUPPLIES) IMPLANT
FIBER LASER FLEXIVA 365 (UROLOGICAL SUPPLIES) ×1 IMPLANT
FIBER LASER TRAC TIP (UROLOGICAL SUPPLIES) IMPLANT
GLOVE BIO SURGEON STRL SZ 6.5 (GLOVE) ×1 IMPLANT
GLOVE BIO SURGEON STRL SZ7 (GLOVE) ×1 IMPLANT
GLOVE BIO SURGEON STRL SZ8 (GLOVE) ×2 IMPLANT
GLOVE BIOGEL PI IND STRL 7.5 (GLOVE) IMPLANT
GLOVE BIOGEL PI INDICATOR 7.5 (GLOVE) ×2
GOWN STRL REUS W/ TWL LRG LVL3 (GOWN DISPOSABLE) IMPLANT
GOWN STRL REUS W/TWL LRG LVL3 (GOWN DISPOSABLE) ×4
GOWN STRL REUS W/TWL XL LVL3 (GOWN DISPOSABLE) ×2 IMPLANT
GUIDEWIRE ANG ZIPWIRE 038X150 (WIRE) IMPLANT
GUIDEWIRE STR DUAL SENSOR (WIRE) ×2 IMPLANT
IV NS IRRIG 3000ML ARTHROMATIC (IV SOLUTION) ×4 IMPLANT
KIT TURNOVER CYSTO (KITS) ×2 IMPLANT
MANIFOLD NEPTUNE II (INSTRUMENTS) ×1 IMPLANT
NS IRRIG 500ML POUR BTL (IV SOLUTION) ×2 IMPLANT
PACK CYSTO (CUSTOM PROCEDURE TRAY) ×2 IMPLANT
SHEATH URETERAL 12FRX35CM (MISCELLANEOUS) ×1 IMPLANT
STENT POLARIS LOOP 6FR X 24 CM (STENTS) ×1 IMPLANT
TUBE CONNECTING 12X1/4 (SUCTIONS) ×1 IMPLANT
TUBING UROLOGY SET (TUBING) ×1 IMPLANT
WATER STERILE IRR 3000ML UROMA (IV SOLUTION) IMPLANT

## 2019-06-25 NOTE — Op Note (Signed)
PATIENT:  Connor King  PRE-OPERATIVE DIAGNOSIS:  left Ureteral calculus  POST-OPERATIVE DIAGNOSIS: Same  PROCEDURE:  1. Left retrograde pyelogram with interpretation 2. Left ureteroscopy, laser lithotripsy, stone basketing and stent placement 3. Fluoroscopy less than 1 hour  SURGEON: Claybon Jabs, MD  INDICATION: [name] is a 69 year old male who underwent lithotripsy of a left ureteral stone with incomplete fragmentation.  The stone has persisted in the left ureter without progression despite tamsulosin and he therefore is brought to the operating room for ureteroscopic treatment of his stone.  ANESTHESIA:  General  EBL:  Minimal  DRAINS: 6 French, 24 cm Polaris stent in the left ureter (with string)  SPECIMEN: Stone given to patient  DESCRIPTION OF PROCEDURE: The patient was taken to the major OR and placed on the table. General anesthesia was administered and then the patient was moved to the dorsal lithotomy position. The genitalia was sterilely prepped and draped. An official timeout was performed.  Initially the 63 French cystoscope with 30 lens was passed under direct vision into the bladder. The bladder was then fully inspected. It was noted be free of any tumors, stones or inflammatory lesions. Ureteral orifices were of normal configuration and position. A 6 French open-ended ureteral catheter was then passed through the cystoscope into the ureteral orifice in order to perform a left retrograde pyelogram.  A retrograde pyelogram was performed by injecting full-strength contrast up the left ureter under direct fluoroscopic control. It revealed a filling defect in the mid ureter consistent with the stone seen on the preoperative KUB. The remainder of the ureter was noted to be normal as was the intrarenal collecting system. I then passed a 0.038 inch floppy-tipped guidewire through the open ended catheter and into the area of the renal pelvis and this was left in place.  The inner portion of a ureteral access sheath was then passed over the guidewire to gently dilate the intramural ureter. I then proceeded with ureteroscopy.  A 6 French dual-lumen, rigid ureteroscope was then passed under direct into the bladder and into the left orifice and up the ureter. The stone was identified and I felt it was too large to extract and therefore elected to proceed with laser lithotripsy. The 365  holmium laser fiber was used to fragment the stone. I then used the nitinol basket to extract all of the stone fragments and reinspection of the ureter ureteroscopically revealed no further stone fragments and no injury to the ureter. I then backloaded the cystoscope over the guidewire and passed the stent over the guidewire into the area of the renal pelvis. As the guidewire was removed good curl was noted in the renal pelvis. The bladder was drained and the cystoscope was then removed. The patient tolerated the procedure well no intraoperative complications.  PLAN OF CARE: Discharge to home after PACU  PATIENT DISPOSITION:  PACU - hemodynamically stable.

## 2019-06-25 NOTE — Transfer of Care (Signed)
Immediate Anesthesia Transfer of Care Note  Patient: Connor King  Procedure(s) Performed: CYSTOSCOPY/RETROGRADE/URETEROSCOPY/STONE EXTRACTION WITH BASKET/ POSSIBLE STENT PLACEMENT (Left Renal)  Patient Location: PACU  Anesthesia Type:General  Level of Consciousness: awake, alert  and oriented  Airway & Oxygen Therapy: Patient Spontanous Breathing and Patient connected to nasal cannula oxygen  Post-op Assessment: Report given to RN and Post -op Vital signs reviewed and stable  Post vital signs: Reviewed and stable  Last Vitals:  Vitals Value Taken Time  BP    Temp    Pulse 72 06/25/19 0942  Resp 18 06/25/19 0942  SpO2 98 % 06/25/19 0942  Vitals shown include unvalidated device data.  Last Pain:  Vitals:   06/25/19 0745  TempSrc: Oral  PainSc: 0-No pain      Patients Stated Pain Goal: 5 ([id number])  Complications: No apparent anesthesia complications

## 2019-06-25 NOTE — Discharge Instructions (Signed)
Post stone removal/stent placement surgery instructions   Definitions:  Ureter: The duct that transports urine from the kidney to the bladder. Stent: A plastic hollow tube that is placed into the ureter, from the kidney to the bladder to prevent the ureter from swelling shut.  General instructions:  Despite the fact that no skin incisions were used, the area around the ureter and bladder is raw and irritated. The stent is a foreign body which will further irritate the bladder wall. This irritation is manifested by increased frequency of urination, both day and night, and by an increase in the urge to urinate. In some, the urge to urinate is present almost always. Sometimes the urge is strong enough that you may not be able to stop your self from urinating. The only real cure is to remove the stent and then give time for the bladder wall to heal which can't be done until the danger of the ureter swelling shut has passed. (This varies from 2-21 days).  You may see some blood in your urine while the stent is in place and a few days afterward. Do not be alarmed, even if the urine is clear for a while. Get off your feet and drink lots of fluids until clearing occurs. If you start to pass clots or don't improve, call us.  If you have a string coming from your urethra:  The stent string is attached to your ureteral stent.  Do not pull on thisIf you have a string coming from your urethra:  The stent string is attached to your ureteral stent.  Do not pull on this.  Diet:  You may return to your normal diet immediately. Because of the raw surface of your bladder, alcohol, spicy foods, foods high in acid and drinks with caffeine may cause irritation or frequency and should be used in moderation. To keep your urine flowing freely and avoid constipation, drink plenty of fluids during the day (8-10 glasses). Tip: Avoid cranberry juice because it is very acidic.  Activity:  Your physical activity doesn't need  to be restricted. However, if you are very active, you may see some blood in the urine. We suggest that you reduce your activity under the circumstances until the bleeding has stopped.  Bowels:  It is important to keep your bowels regular during the postoperative period. Straining with bowel movements can cause bleeding. A bowel movement every other day is reasonable. Use a mild laxative if needed, such as milk of magnesia 2-3 tablespoons, or 2 Dulcolax tablets. Call if you continue to have problems. If you had been taking narcotics for pain, before, during or after your surgery, you may be constipated. Take a laxative if necessary.     Medication:  You should resume your pre-surgery medications unless told not to. DO NOT RESUME YOUR ASPIRIN, or any other medicines like ibuprofen, motrin, excedrin, advil, aleve, vitamin E, fish oil as these can all cause bleeding x 7 days. In addition you may be given an antibiotic to prevent or treat infection. Antibiotics are not always necessary. All medication should be taken as prescribed until the bottles are finished unless you are having an unusual reaction to one of the drugs.  Problems you should report to [place]:  a. Fever greater than 101F. b. Heavy bleeding, or clots (see notes above about blood in urine). c. Inability to urinate. d. Drug reactions (hives, rash, nausea, vomiting, diarrhea). e. Severe burning or pain with urination that is not improving.   YOU  MAY RESUME XARELTO TOMORROW IF URINE NOT BLOODY.   OTHERWISE, WAIT ANOTHER 24 HOURS BEFORE RESUMING.  ANY QUESTIONS, CALL [phone] AND SPEAK TO PHYSICIAN ON CALL.  Followup:  You will need a followup appointment to monitor your progress in most cases. Please call the office for this appointment when you get home if your appointment has not already been scheduled. Usually the first appointment will be about 5-14 days after your surgery and if you have a stent in place it will likely be  removed at that time.  CYSTOSCOPY HOME CARE INSTRUCTIONS  Activity: Rest for the remainder of the day.  Do not drive or operate equipment today.  You may resume normal activities in one to two days as instructed by your physician.   Meals: Drink plenty of liquids and eat light foods such as gelatin or soup this evening.  You may return to a normal meal plan tomorrow.  Return to Work: You may return to work in one to two days or as instructed by your physician.  Special Instructions / Symptoms: Call your physician if any of these symptoms occur:   -persistent or heavy bleeding  -bleeding which continues after first few urination  -large blood clots that are difficult to pass  -urine stream diminishes or stops completely  -fever equal to or higher than 101 degrees Farenheit.  -cloudy urine with a strong, foul odor  -severe pain  Females should always wipe from front to back after elimination.  You may feel some burning pain when you urinate.  This should disappear with time.  Applying moist heat to the lower abdomen or a hot tub bath may help relieve the pain. \  Follow-Up / Date of Return Visit to Your Physician: AS INSTRUCTED Call for an appointment to arrange follow-up.  Patient Signature:  ________________________________________________________  Nurse's Signature:  ________________________________________________________    Post Anesthesia Home Care Instructions  Activity: Get plenty of rest for the remainder of the day. A responsible individual must stay with you for 24 hours following the procedure.  For the next 24 hours, DO NOT: -Drive a car -Paediatric nurse -Drink alcoholic beverages -Take any medication unless instructed by your physician -Make any legal decisions or sign important papers.  Meals: Start with liquid foods such as gelatin or soup. Progress to regular foods as tolerated. Avoid greasy, spicy, heavy foods. If nausea and/or vomiting occur, drink  only clear liquids until the nausea and/or vomiting subsides. Call your physician if vomiting continues.  Special Instructions/Symptoms: Your throat may feel dry or sore from the anesthesia or the breathing tube placed in your throat during surgery. If this causes discomfort, gargle with warm salt water. The discomfort should disappear within 24 hours.  If you had a scopolamine patch placed behind your ear for the management of post- operative nausea and/or vomiting:  1. The medication in the patch is effective for 72 hours, after which it should be removed.  Wrap patch in a tissue and discard in the trash. Wash hands thoroughly with soap and water. 2. You may remove the patch earlier than 72 hours if you experience unpleasant side effects which may include dry mouth, dizziness or visual disturbances. 3. Avoid touching the patch. Wash your hands with soap and water after contact with the patch.

## 2019-06-25 NOTE — Anesthesia Procedure Notes (Signed)
Procedure Name: LMA Insertion Date/Time: 06/25/2019 8:48 AM Performed by: Bufford Spikes, CRNA Pre-anesthesia Checklist: Patient identified, Emergency Drugs available, Suction available and Patient being monitored Patient Re-evaluated:Patient Re-evaluated prior to induction Oxygen Delivery Method: Circle system utilized Preoxygenation: Pre-oxygenation with 100% oxygen Induction Type: IV induction Ventilation: Mask ventilation without difficulty LMA: LMA inserted LMA Size: 5.0 Number of attempts: 1 Airway Equipment and Method: Bite block Placement Confirmation: positive ETCO2 Tube secured with: Tape Dental Injury: Teeth and Oropharynx as per pre-operative assessment

## 2019-06-25 NOTE — Anesthesia Postprocedure Evaluation (Signed)
Anesthesia Post Note  Patient: Connor King  Procedure(s) Performed: CYSTOSCOPY/RETROGRADE/URETEROSCOPY/STONE EXTRACTION WITH BASKET/ POSSIBLE STENT PLACEMENT (Left Renal)     Patient location during evaluation: PACU Anesthesia Type: General Level of consciousness: sedated and patient cooperative Pain management: pain level controlled Vital Signs Assessment: post-procedure vital signs reviewed and stable Respiratory status: spontaneous breathing Cardiovascular status: stable Anesthetic complications: no    Last Vitals:  Vitals:   06/25/19 1000 06/25/19 1127  BP: 136/81 136/79  Pulse: 60 (!) 53  Resp: 11 16  Temp:  36.8 C  SpO2: 100% 96%    Last Pain:  Vitals:   06/25/19 1127  TempSrc:   PainSc: 0-No pain                 Nolon Nations

## 2019-06-25 NOTE — Anesthesia Preprocedure Evaluation (Signed)
Anesthesia Evaluation  Patient identified by MRN, date of birth, ID band Patient awake    Reviewed: Allergy & Precautions, NPO status , Patient's Chart, lab work & pertinent test results  Airway Mallampati: II  TM Distance: >3 FB     Dental   Pulmonary sleep apnea ,    breath sounds clear to auscultation       Cardiovascular  Rhythm:Regular Rate:Normal     Neuro/Psych    GI/Hepatic Neg liver ROS, hiatal hernia,   Endo/Other  diabetesMorbid obesity  Renal/GU negative Renal ROS     Musculoskeletal   Abdominal (+) + obese,   Peds  Hematology   Anesthesia Other Findings   Reproductive/Obstetrics                             Anesthesia Physical  Anesthesia Plan  ASA: III  Anesthesia Plan: General   Post-op Pain Management:    Induction: Intravenous  PONV Risk Score and Plan: 1 and Ondansetron and Dexamethasone  Airway Management Planned: LMA  Additional Equipment: None  Intra-op Plan:   Post-operative Plan: Extubation in OR  Informed Consent: I have reviewed the patients History and Physical, chart, labs and discussed the procedure including the risks, benefits and alternatives for the proposed anesthesia with the patient or authorized representative who has indicated his/her understanding and acceptance.     Dental advisory given  Plan Discussed with: CRNA  Anesthesia Plan Comments:         Anesthesia Quick Evaluation

## 2019-06-28 ENCOUNTER — Encounter (HOSPITAL_BASED_OUTPATIENT_CLINIC_OR_DEPARTMENT_OTHER): Payer: Self-pay | Admitting: Urology

## 2019-08-12 DIAGNOSIS — E1142 Type 2 diabetes mellitus with diabetic polyneuropathy: Secondary | ICD-10-CM | POA: Insufficient documentation

## 2019-09-08 IMAGING — CT CT HEAD W/O CM
3 series · 16 of 47 positions shown, 19 images · non-contrast
Comparison: None.

CLINICAL DATA: Head injury after fall.

EXAM:
CT HEAD WITHOUT CONTRAST
TECHNIQUE: Contiguous axial images were obtained from the base of the skull
through the vertex without intravenous contrast.

[Series 2: head wo · axial · 0.48mm/px · z∈[-103,+22]mm · 10 of 31 slices shown, 13 images]
[im 3/31  brain]
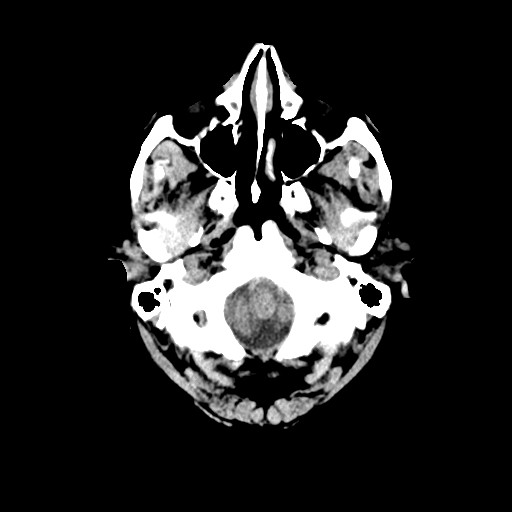
[im 3/31  bone]
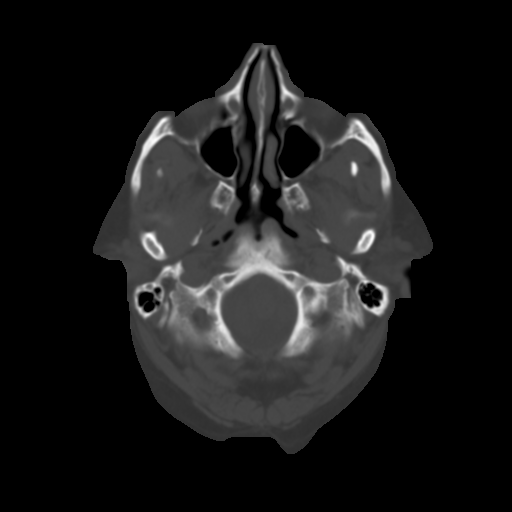
[im 6/31  brain]
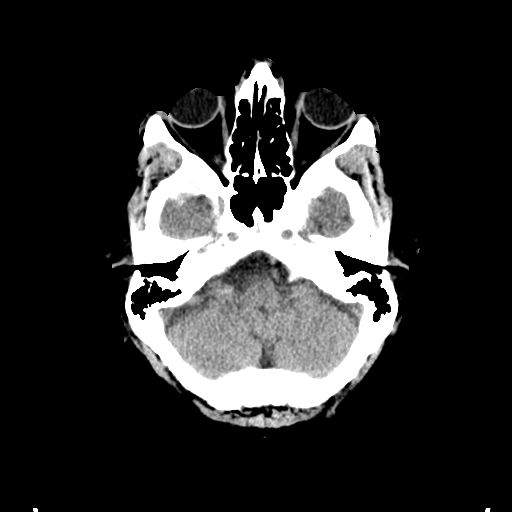
[im 9/31  brain]
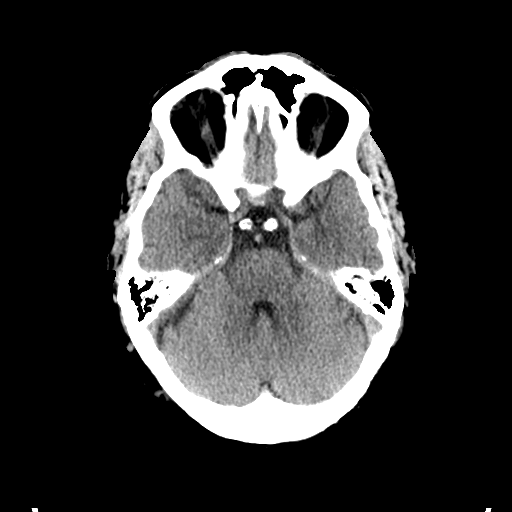
[im 11/31  brain]
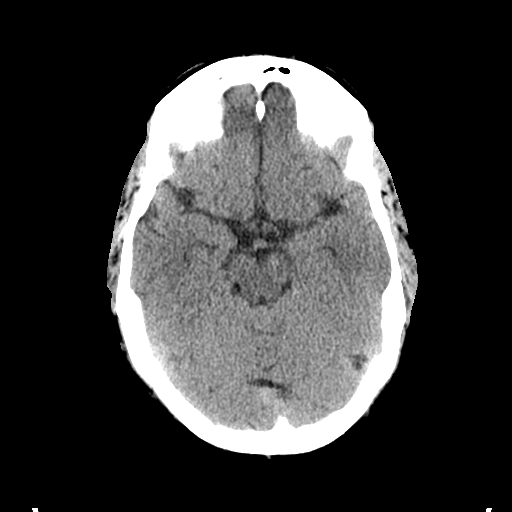
[im 14/31  brain]
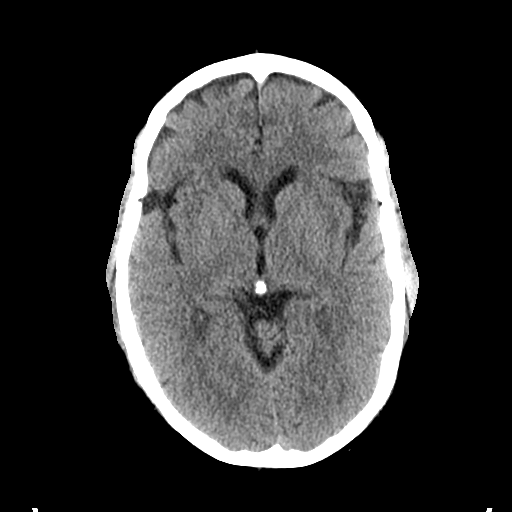
[im 14/31  bone]
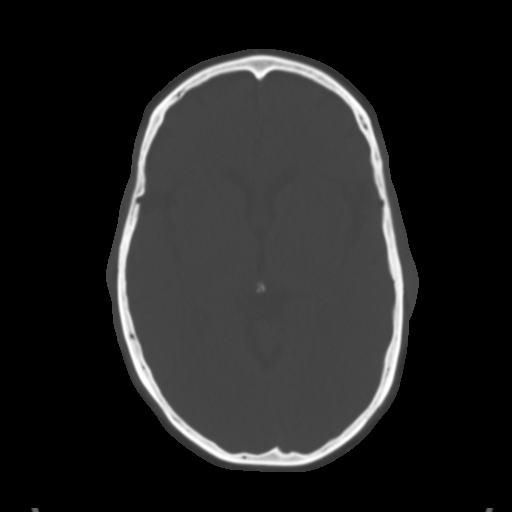
[im 17/31  brain]
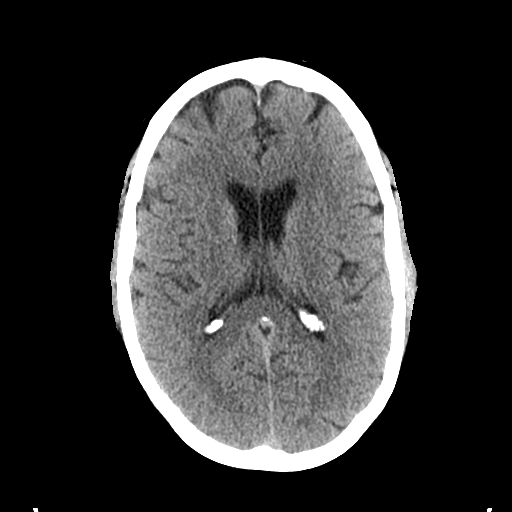
[im 20/31  brain]
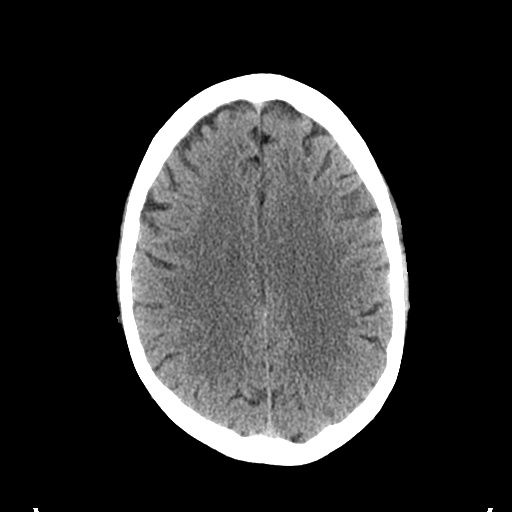
[im 23/31  brain]
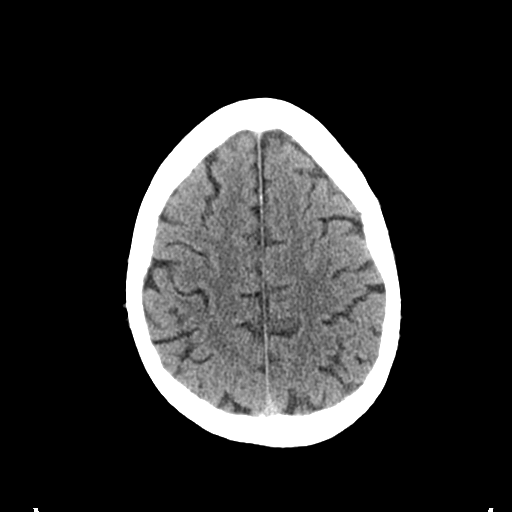
[im 25/31  brain]
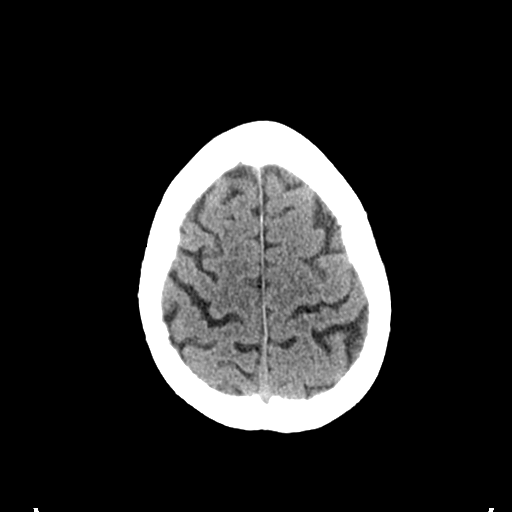
[im 25/31  bone]
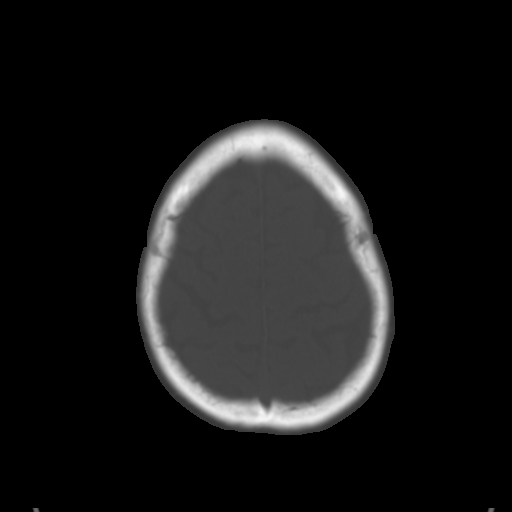
[im 28/31  brain]
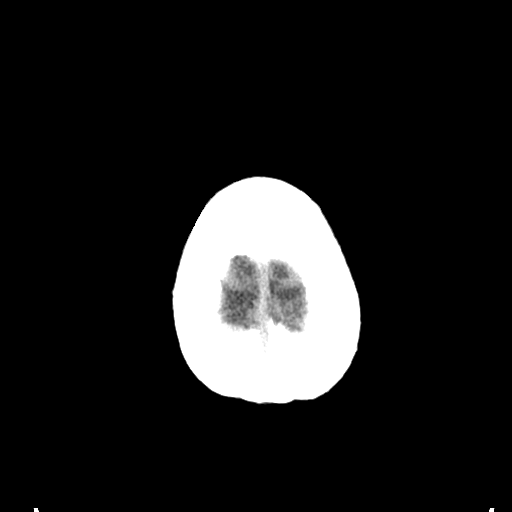

[Series 5: coronal soft tissue · coronal · 0.32mm/px · 3 of 74 slices shown]
[im 25/74  brain]
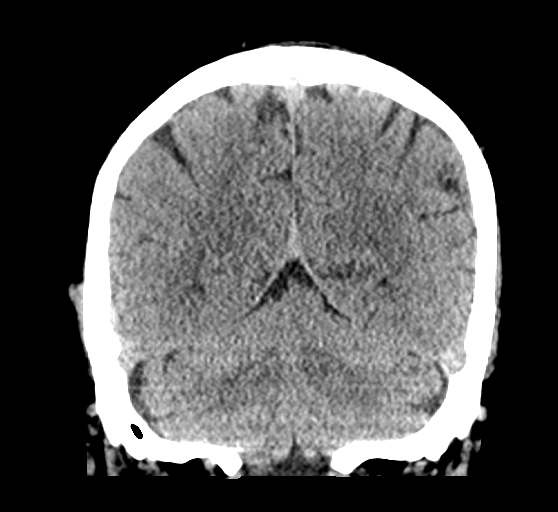
[im 33/74  brain]
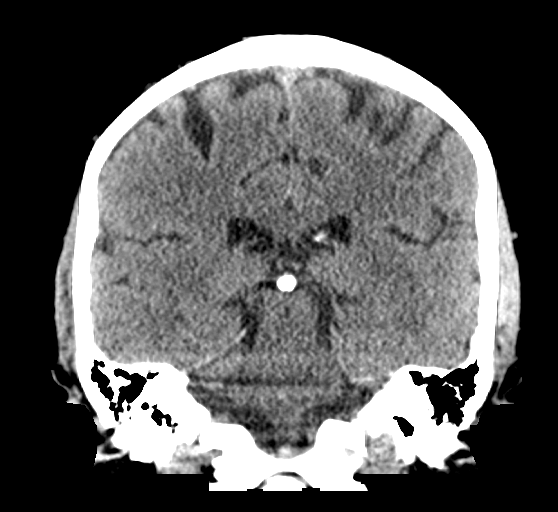
[im 41/74  brain]
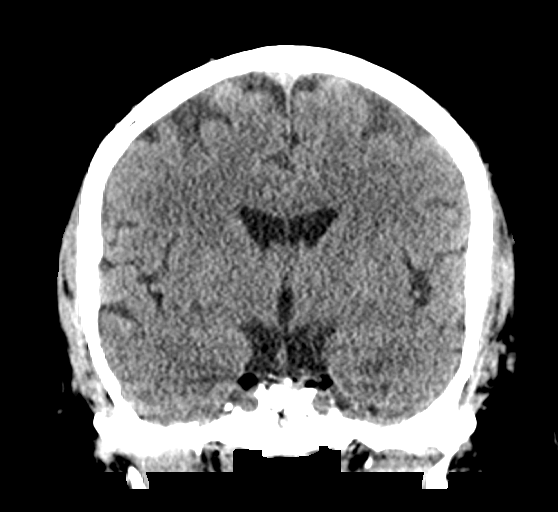

[Series 6: sagittal soft tissue · sagittal · 0.32mm/px · 3 of 58 slices shown]
[im 20/58  brain]
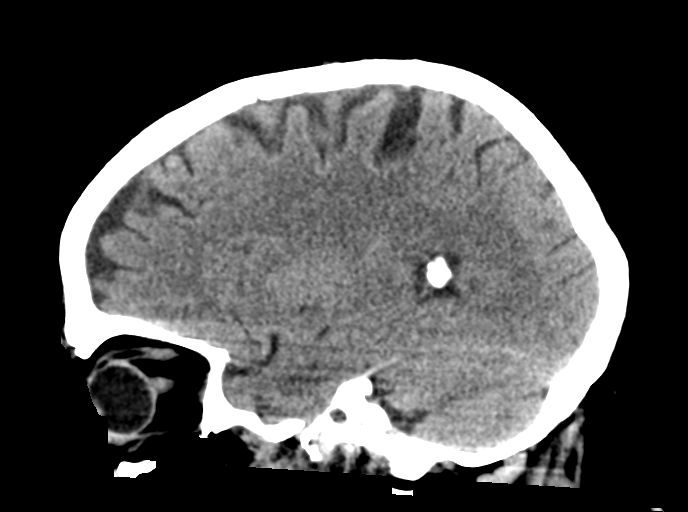
[im 29/58  brain]
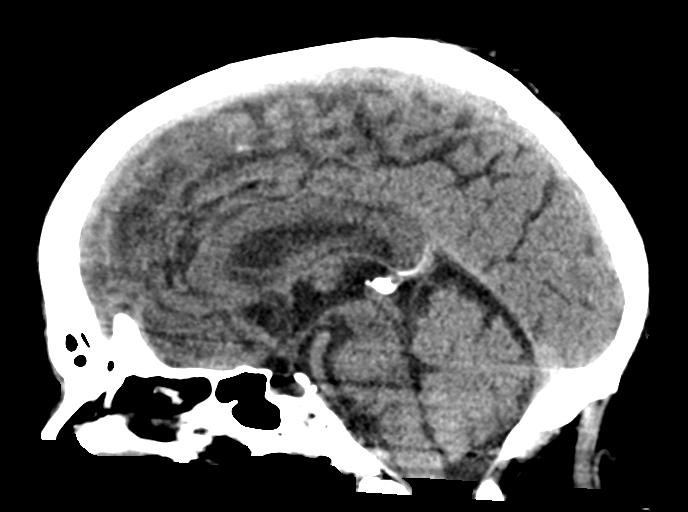
[im 39/58  brain]
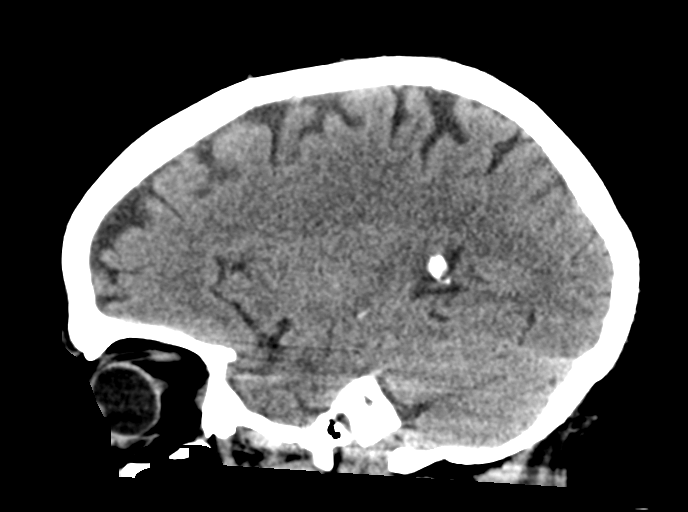

[16 of 47 positions shown; findings below may reference images not displayed]

FINDINGS: Brain: No evidence of acute infarction, hemorrhage, hydrocephalus,
extra-axial collection or mass lesion/mass effect.

Vascular: No hyperdense vessel or unexpected calcification.

Skull: Normal. Negative for fracture or focal lesion.

Sinuses/Orbits: No acute finding.

Other: None.
IMPRESSION: Normal head CT.

## 2019-10-17 ENCOUNTER — Ambulatory Visit: Payer: Medicare PPO | Attending: Internal Medicine

## 2019-10-17 DIAGNOSIS — Z23 Encounter for immunization: Secondary | ICD-10-CM | POA: Insufficient documentation

## 2019-10-17 NOTE — Progress Notes (Signed)
   Covid-19 Vaccination Clinic  Name:  Connor King    MRN: [id number] DOB: July 19, 1951  10/17/2019  Mr. Connor King was observed post Covid-19 immunization for 15 minutes without incidence. He was provided with Vaccine Information Sheet and instruction to access the V-Safe system.   Mr. Connor King was instructed to call 911 with any severe reactions post vaccine: Marland Kitchen Difficulty breathing  . Swelling of your face and throat  . A fast heartbeat  . A bad rash all over your body  . Dizziness and weakness    Immunizations Administered    Name Date Dose VIS Date Route   Pfizer COVID-19 Vaccine 10/17/2019 10:09 AM 0.3 mL 08/13/2019 Intramuscular   Manufacturer: [place]   Lot: [id number]   [place]: [id number]

## 2019-11-09 ENCOUNTER — Ambulatory Visit: Payer: Medicare PPO | Attending: Internal Medicine

## 2019-11-09 DIAGNOSIS — Z23 Encounter for immunization: Secondary | ICD-10-CM | POA: Insufficient documentation

## 2019-11-09 NOTE — Progress Notes (Signed)
   Covid-19 Vaccination Clinic  Name:  MELIQUE FUHRMANN    MRN: [id number] DOB: 1950/09/11  11/09/2019  Mr. Lindquist was observed post Covid-19 immunization for 15 minutes without incident. He was provided with Vaccine Information Sheet and instruction to access the V-Safe system.   Mr. Lanier was instructed to call 911 with any severe reactions post vaccine: Marland Kitchen Difficulty breathing  . Swelling of face and throat  . A fast heartbeat  . A bad rash all over body  . Dizziness and weakness   Immunizations Administered    Name Date Dose VIS Date Route   Pfizer COVID-19 Vaccine 11/09/2019 11:33 AM 0.3 mL 08/13/2019 Intramuscular   Manufacturer: [employer]   Lot: [id number]   [place]: [id number]

## 2020-03-29 DIAGNOSIS — Z6841 Body Mass Index (BMI) 40.0 and over, adult: Secondary | ICD-10-CM | POA: Diagnosis not present

## 2020-03-29 DIAGNOSIS — Z86711 Personal history of pulmonary embolism: Secondary | ICD-10-CM | POA: Diagnosis not present

## 2020-03-29 DIAGNOSIS — I10 Essential (primary) hypertension: Secondary | ICD-10-CM | POA: Diagnosis not present

## 2020-03-29 DIAGNOSIS — G250 Essential tremor: Secondary | ICD-10-CM | POA: Diagnosis not present

## 2020-03-29 DIAGNOSIS — E781 Pure hyperglyceridemia: Secondary | ICD-10-CM | POA: Diagnosis not present

## 2020-03-29 DIAGNOSIS — E1169 Type 2 diabetes mellitus with other specified complication: Secondary | ICD-10-CM | POA: Diagnosis not present

## 2020-03-29 DIAGNOSIS — Z0000 Encounter for general adult medical examination without abnormal findings: Secondary | ICD-10-CM | POA: Diagnosis not present

## 2020-03-29 DIAGNOSIS — G4733 Obstructive sleep apnea (adult) (pediatric): Secondary | ICD-10-CM | POA: Diagnosis not present

## 2020-03-29 DIAGNOSIS — Z1389 Encounter for screening for other disorder: Secondary | ICD-10-CM | POA: Diagnosis not present

## 2020-03-30 IMAGING — CR ABDOMEN - 1 VIEW
2 series · 2 of 2 positions shown · non-contrast
Comparison: Outside KUB 03/30/2019. Ultrasound 07/24/2012. CT
12/20/2004 report.

CLINICAL DATA: Preoperative study for lithotripsy. Left-sided
kidney stone.

EXAM:
ABDOMEN - 1 VIEW

[t abdomen supine (1 of 2)]
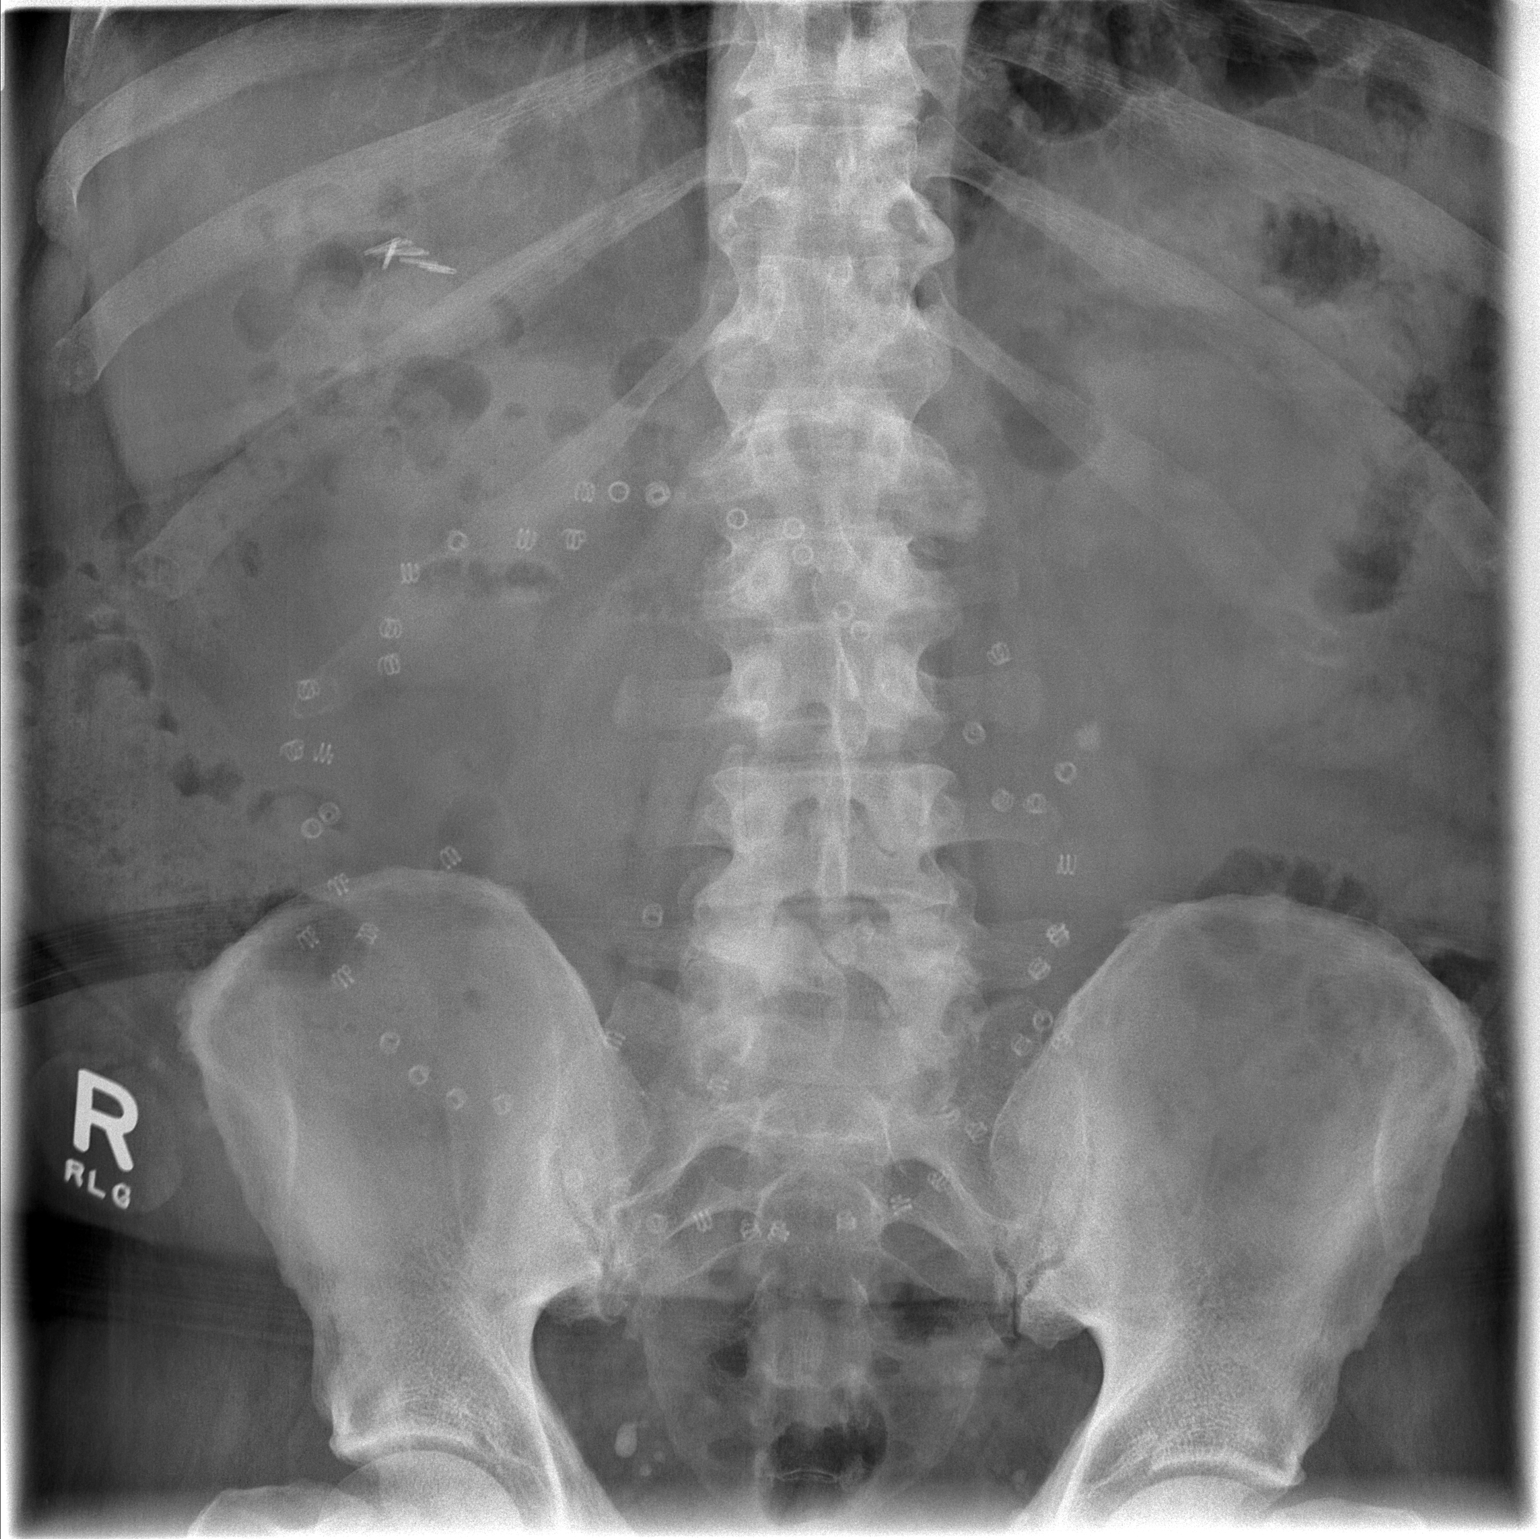

[t abdomen supine (2 of 2)]
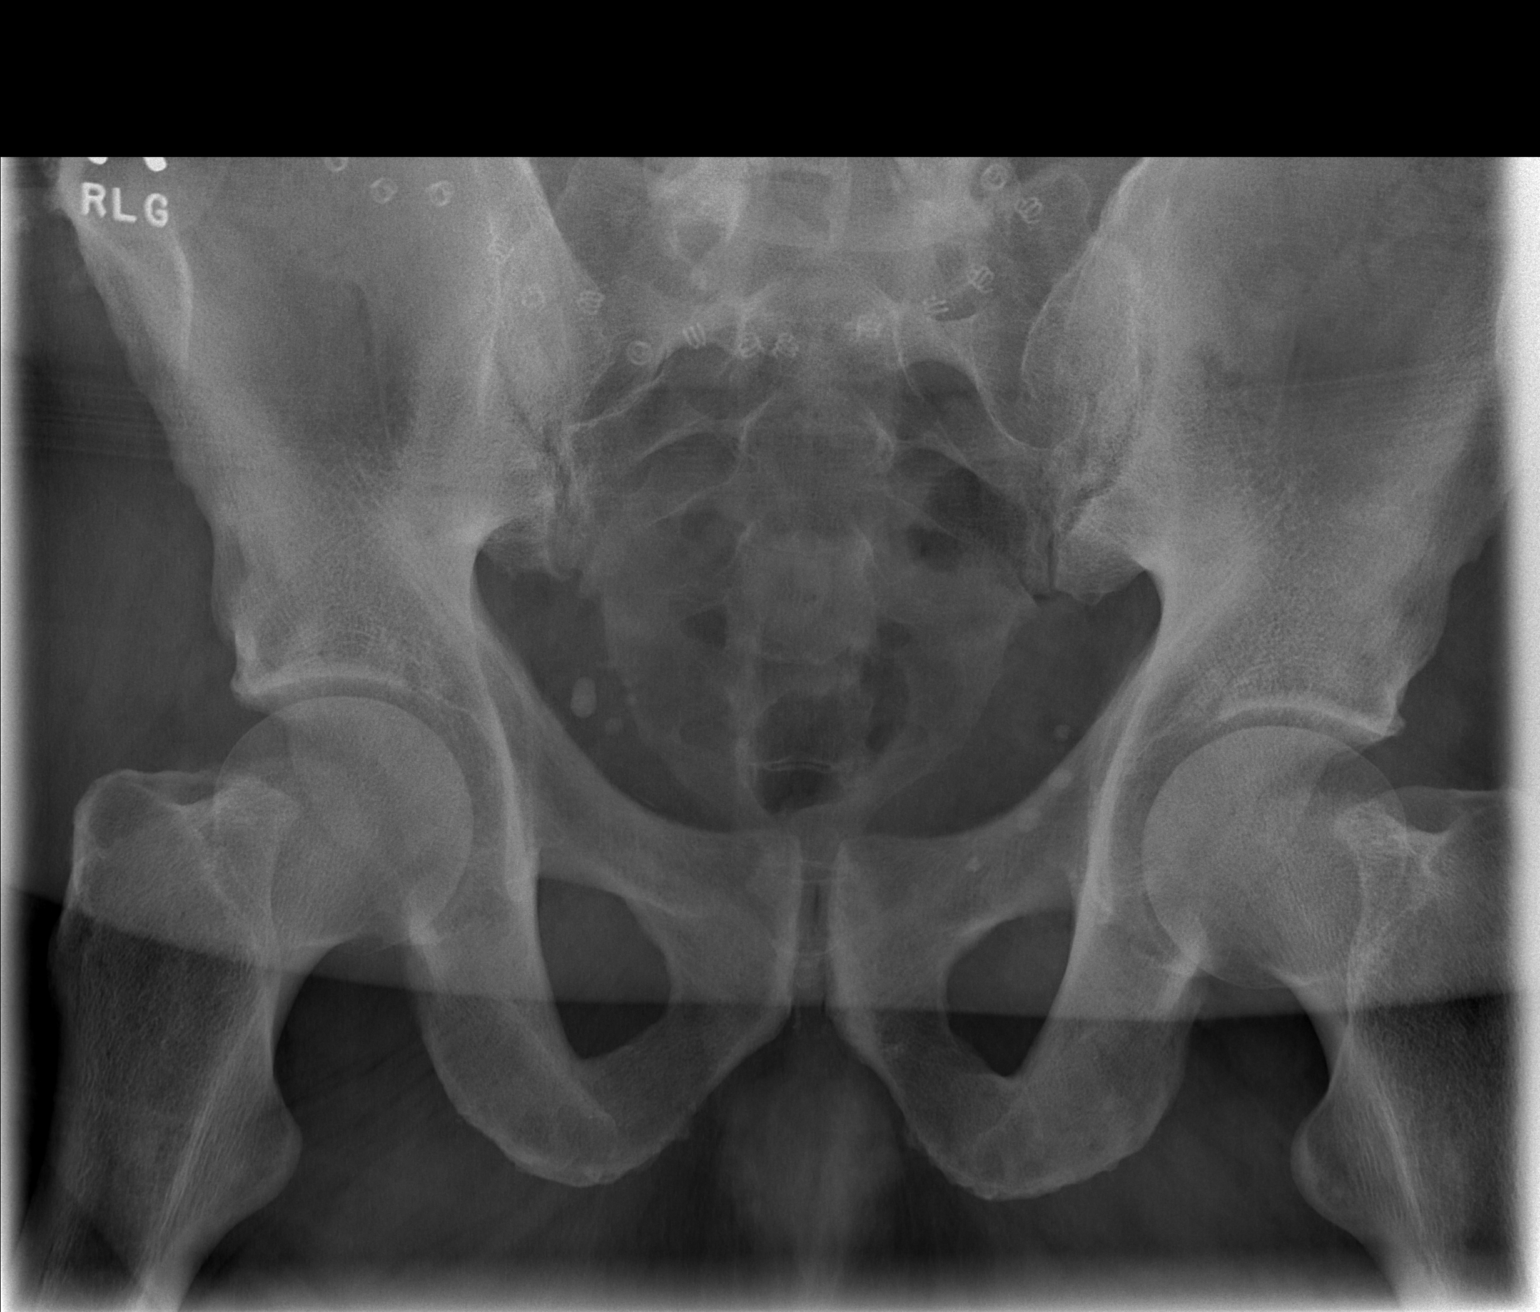

[2 of 2 positions shown; findings below may reference images not displayed]

FINDINGS: Surgical clips right upper quadrant. Surgical staples noted over the
abdomen. 9 mm calcific density noted over the region of the left
upper ureter most consistent left upper ureteral stone. Stable
pelvic calcifications most likely phleboliths. Degenerative change
lumbar spine.
IMPRESSION: 9 mm calcific density noted over the region of the left upper ureter
most consistent with a left upper ureteral stone.

## 2020-04-10 DIAGNOSIS — H52203 Unspecified astigmatism, bilateral: Secondary | ICD-10-CM | POA: Diagnosis not present

## 2020-04-10 DIAGNOSIS — E119 Type 2 diabetes mellitus without complications: Secondary | ICD-10-CM | POA: Diagnosis not present

## 2020-04-10 DIAGNOSIS — H2513 Age-related nuclear cataract, bilateral: Secondary | ICD-10-CM | POA: Diagnosis not present

## 2020-04-13 DIAGNOSIS — G4733 Obstructive sleep apnea (adult) (pediatric): Secondary | ICD-10-CM | POA: Diagnosis not present

## 2020-04-19 DIAGNOSIS — G4733 Obstructive sleep apnea (adult) (pediatric): Secondary | ICD-10-CM | POA: Diagnosis not present

## 2020-04-26 DIAGNOSIS — C44319 Basal cell carcinoma of skin of other parts of face: Secondary | ICD-10-CM | POA: Diagnosis not present

## 2020-04-26 DIAGNOSIS — Z85828 Personal history of other malignant neoplasm of skin: Secondary | ICD-10-CM | POA: Diagnosis not present

## 2020-06-05 DIAGNOSIS — R509 Fever, unspecified: Secondary | ICD-10-CM | POA: Diagnosis not present

## 2020-06-05 DIAGNOSIS — R197 Diarrhea, unspecified: Secondary | ICD-10-CM | POA: Diagnosis not present

## 2020-06-05 DIAGNOSIS — R059 Cough, unspecified: Secondary | ICD-10-CM | POA: Diagnosis not present

## 2020-06-08 DIAGNOSIS — U071 COVID-19: Secondary | ICD-10-CM | POA: Diagnosis not present

## 2020-06-30 DIAGNOSIS — G4733 Obstructive sleep apnea (adult) (pediatric): Secondary | ICD-10-CM | POA: Diagnosis not present

## 2020-08-01 DIAGNOSIS — Z8616 Personal history of COVID-19: Secondary | ICD-10-CM | POA: Diagnosis not present

## 2020-08-01 DIAGNOSIS — E1169 Type 2 diabetes mellitus with other specified complication: Secondary | ICD-10-CM | POA: Diagnosis not present

## 2020-08-01 DIAGNOSIS — Z7984 Long term (current) use of oral hypoglycemic drugs: Secondary | ICD-10-CM | POA: Diagnosis not present

## 2020-08-22 DIAGNOSIS — Z87442 Personal history of urinary calculi: Secondary | ICD-10-CM | POA: Diagnosis not present

## 2020-08-22 DIAGNOSIS — Z125 Encounter for screening for malignant neoplasm of prostate: Secondary | ICD-10-CM | POA: Diagnosis not present

## 2020-09-28 DIAGNOSIS — G4733 Obstructive sleep apnea (adult) (pediatric): Secondary | ICD-10-CM | POA: Diagnosis not present

## 2020-11-27 DIAGNOSIS — E1142 Type 2 diabetes mellitus with diabetic polyneuropathy: Secondary | ICD-10-CM | POA: Diagnosis not present

## 2020-11-27 DIAGNOSIS — G4733 Obstructive sleep apnea (adult) (pediatric): Secondary | ICD-10-CM | POA: Diagnosis not present

## 2020-12-27 DIAGNOSIS — G4733 Obstructive sleep apnea (adult) (pediatric): Secondary | ICD-10-CM | POA: Diagnosis not present

## 2021-01-31 DIAGNOSIS — L821 Other seborrheic keratosis: Secondary | ICD-10-CM | POA: Diagnosis not present

## 2021-01-31 DIAGNOSIS — L570 Actinic keratosis: Secondary | ICD-10-CM | POA: Diagnosis not present

## 2021-01-31 DIAGNOSIS — D485 Neoplasm of uncertain behavior of skin: Secondary | ICD-10-CM | POA: Diagnosis not present

## 2021-01-31 DIAGNOSIS — Z85828 Personal history of other malignant neoplasm of skin: Secondary | ICD-10-CM | POA: Diagnosis not present

## 2021-01-31 DIAGNOSIS — D225 Melanocytic nevi of trunk: Secondary | ICD-10-CM | POA: Diagnosis not present

## 2021-01-31 DIAGNOSIS — C44329 Squamous cell carcinoma of skin of other parts of face: Secondary | ICD-10-CM | POA: Diagnosis not present

## 2021-02-27 DIAGNOSIS — Z85828 Personal history of other malignant neoplasm of skin: Secondary | ICD-10-CM | POA: Diagnosis not present

## 2021-02-27 DIAGNOSIS — C44329 Squamous cell carcinoma of skin of other parts of face: Secondary | ICD-10-CM | POA: Diagnosis not present

## 2021-03-27 DIAGNOSIS — G4733 Obstructive sleep apnea (adult) (pediatric): Secondary | ICD-10-CM | POA: Diagnosis not present

## 2021-04-04 DIAGNOSIS — Z7984 Long term (current) use of oral hypoglycemic drugs: Secondary | ICD-10-CM | POA: Diagnosis not present

## 2021-04-04 DIAGNOSIS — Z86010 Personal history of colonic polyps: Secondary | ICD-10-CM | POA: Diagnosis not present

## 2021-04-04 DIAGNOSIS — Z0000 Encounter for general adult medical examination without abnormal findings: Secondary | ICD-10-CM | POA: Diagnosis not present

## 2021-04-04 DIAGNOSIS — I10 Essential (primary) hypertension: Secondary | ICD-10-CM | POA: Diagnosis not present

## 2021-04-04 DIAGNOSIS — G4733 Obstructive sleep apnea (adult) (pediatric): Secondary | ICD-10-CM | POA: Diagnosis not present

## 2021-04-04 DIAGNOSIS — Z1389 Encounter for screening for other disorder: Secondary | ICD-10-CM | POA: Diagnosis not present

## 2021-04-04 DIAGNOSIS — E1142 Type 2 diabetes mellitus with diabetic polyneuropathy: Secondary | ICD-10-CM | POA: Diagnosis not present

## 2021-04-04 DIAGNOSIS — Z6841 Body Mass Index (BMI) 40.0 and over, adult: Secondary | ICD-10-CM | POA: Diagnosis not present

## 2021-04-04 DIAGNOSIS — Z86711 Personal history of pulmonary embolism: Secondary | ICD-10-CM | POA: Diagnosis not present

## 2021-04-16 DIAGNOSIS — H5213 Myopia, bilateral: Secondary | ICD-10-CM | POA: Diagnosis not present

## 2021-04-16 DIAGNOSIS — H2513 Age-related nuclear cataract, bilateral: Secondary | ICD-10-CM | POA: Diagnosis not present

## 2021-04-23 DIAGNOSIS — H25043 Posterior subcapsular polar age-related cataract, bilateral: Secondary | ICD-10-CM | POA: Diagnosis not present

## 2021-04-23 DIAGNOSIS — H2513 Age-related nuclear cataract, bilateral: Secondary | ICD-10-CM | POA: Diagnosis not present

## 2021-05-08 DIAGNOSIS — H25812 Combined forms of age-related cataract, left eye: Secondary | ICD-10-CM | POA: Diagnosis not present

## 2021-05-08 DIAGNOSIS — H25042 Posterior subcapsular polar age-related cataract, left eye: Secondary | ICD-10-CM | POA: Diagnosis not present

## 2021-05-08 DIAGNOSIS — H2512 Age-related nuclear cataract, left eye: Secondary | ICD-10-CM | POA: Diagnosis not present

## 2021-06-12 DIAGNOSIS — H2511 Age-related nuclear cataract, right eye: Secondary | ICD-10-CM | POA: Diagnosis not present

## 2021-06-12 DIAGNOSIS — H25041 Posterior subcapsular polar age-related cataract, right eye: Secondary | ICD-10-CM | POA: Diagnosis not present

## 2021-06-12 DIAGNOSIS — H25811 Combined forms of age-related cataract, right eye: Secondary | ICD-10-CM | POA: Diagnosis not present

## 2021-06-12 DIAGNOSIS — H59211 Accidental puncture and laceration of right eye and adnexa during an ophthalmic procedure: Secondary | ICD-10-CM | POA: Diagnosis not present

## 2021-06-26 DIAGNOSIS — G4733 Obstructive sleep apnea (adult) (pediatric): Secondary | ICD-10-CM | POA: Diagnosis not present

## 2021-08-06 DIAGNOSIS — G250 Essential tremor: Secondary | ICD-10-CM | POA: Diagnosis not present

## 2021-08-06 DIAGNOSIS — E1142 Type 2 diabetes mellitus with diabetic polyneuropathy: Secondary | ICD-10-CM | POA: Diagnosis not present

## 2021-11-26 DIAGNOSIS — L570 Actinic keratosis: Secondary | ICD-10-CM | POA: Diagnosis not present

## 2021-11-26 DIAGNOSIS — Z85828 Personal history of other malignant neoplasm of skin: Secondary | ICD-10-CM | POA: Diagnosis not present

## 2021-12-06 DIAGNOSIS — Z5181 Encounter for therapeutic drug level monitoring: Secondary | ICD-10-CM | POA: Diagnosis not present

## 2021-12-06 DIAGNOSIS — E1142 Type 2 diabetes mellitus with diabetic polyneuropathy: Secondary | ICD-10-CM | POA: Diagnosis not present

## 2021-12-06 DIAGNOSIS — E781 Pure hyperglyceridemia: Secondary | ICD-10-CM | POA: Diagnosis not present

## 2021-12-06 DIAGNOSIS — R109 Unspecified abdominal pain: Secondary | ICD-10-CM | POA: Diagnosis not present

## 2021-12-06 DIAGNOSIS — G4733 Obstructive sleep apnea (adult) (pediatric): Secondary | ICD-10-CM | POA: Diagnosis not present

## 2022-01-01 DIAGNOSIS — G8929 Other chronic pain: Secondary | ICD-10-CM | POA: Diagnosis not present

## 2022-01-01 DIAGNOSIS — M25561 Pain in right knee: Secondary | ICD-10-CM | POA: Diagnosis not present

## 2022-01-01 DIAGNOSIS — M79671 Pain in right foot: Secondary | ICD-10-CM | POA: Diagnosis not present

## 2022-01-08 DIAGNOSIS — M1711 Unilateral primary osteoarthritis, right knee: Secondary | ICD-10-CM | POA: Diagnosis not present

## 2022-01-08 DIAGNOSIS — M19071 Primary osteoarthritis, right ankle and foot: Secondary | ICD-10-CM | POA: Diagnosis not present

## 2022-01-11 DIAGNOSIS — E781 Pure hyperglyceridemia: Secondary | ICD-10-CM | POA: Diagnosis not present

## 2022-01-11 DIAGNOSIS — E1142 Type 2 diabetes mellitus with diabetic polyneuropathy: Secondary | ICD-10-CM | POA: Diagnosis not present

## 2022-01-11 DIAGNOSIS — G8929 Other chronic pain: Secondary | ICD-10-CM | POA: Diagnosis not present

## 2022-01-11 DIAGNOSIS — Z86711 Personal history of pulmonary embolism: Secondary | ICD-10-CM | POA: Diagnosis not present

## 2022-01-29 DIAGNOSIS — M1711 Unilateral primary osteoarthritis, right knee: Secondary | ICD-10-CM | POA: Diagnosis not present

## 2022-02-06 DIAGNOSIS — Z85828 Personal history of other malignant neoplasm of skin: Secondary | ICD-10-CM | POA: Diagnosis not present

## 2022-02-06 DIAGNOSIS — L821 Other seborrheic keratosis: Secondary | ICD-10-CM | POA: Diagnosis not present

## 2022-02-06 DIAGNOSIS — L570 Actinic keratosis: Secondary | ICD-10-CM | POA: Diagnosis not present

## 2022-02-06 DIAGNOSIS — L565 Disseminated superficial actinic porokeratosis (DSAP): Secondary | ICD-10-CM | POA: Diagnosis not present

## 2022-02-06 DIAGNOSIS — D225 Melanocytic nevi of trunk: Secondary | ICD-10-CM | POA: Diagnosis not present

## 2022-02-12 DIAGNOSIS — M25561 Pain in right knee: Secondary | ICD-10-CM | POA: Diagnosis not present

## 2022-02-12 DIAGNOSIS — Z96652 Presence of left artificial knee joint: Secondary | ICD-10-CM | POA: Diagnosis not present

## 2022-02-12 DIAGNOSIS — Z01812 Encounter for preprocedural laboratory examination: Secondary | ICD-10-CM | POA: Diagnosis not present

## 2022-02-12 DIAGNOSIS — M1711 Unilateral primary osteoarthritis, right knee: Secondary | ICD-10-CM | POA: Diagnosis not present

## 2022-02-18 DIAGNOSIS — M25661 Stiffness of right knee, not elsewhere classified: Secondary | ICD-10-CM | POA: Diagnosis not present

## 2022-02-18 DIAGNOSIS — R262 Difficulty in walking, not elsewhere classified: Secondary | ICD-10-CM | POA: Diagnosis not present

## 2022-02-18 DIAGNOSIS — M1731 Unilateral post-traumatic osteoarthritis, right knee: Secondary | ICD-10-CM | POA: Diagnosis not present

## 2022-02-18 DIAGNOSIS — M25561 Pain in right knee: Secondary | ICD-10-CM | POA: Diagnosis not present

## 2022-02-20 DIAGNOSIS — M1711 Unilateral primary osteoarthritis, right knee: Secondary | ICD-10-CM | POA: Diagnosis not present

## 2022-02-20 DIAGNOSIS — G8918 Other acute postprocedural pain: Secondary | ICD-10-CM | POA: Diagnosis not present

## 2022-02-20 DIAGNOSIS — Z96651 Presence of right artificial knee joint: Secondary | ICD-10-CM | POA: Diagnosis not present

## 2022-02-22 DIAGNOSIS — M6281 Muscle weakness (generalized): Secondary | ICD-10-CM | POA: Diagnosis not present

## 2022-02-22 DIAGNOSIS — Z96651 Presence of right artificial knee joint: Secondary | ICD-10-CM | POA: Diagnosis not present

## 2022-02-22 DIAGNOSIS — M25661 Stiffness of right knee, not elsewhere classified: Secondary | ICD-10-CM | POA: Diagnosis not present

## 2022-02-27 DIAGNOSIS — Z96651 Presence of right artificial knee joint: Secondary | ICD-10-CM | POA: Diagnosis not present

## 2022-02-27 DIAGNOSIS — M25661 Stiffness of right knee, not elsewhere classified: Secondary | ICD-10-CM | POA: Diagnosis not present

## 2022-02-27 DIAGNOSIS — M6281 Muscle weakness (generalized): Secondary | ICD-10-CM | POA: Diagnosis not present

## 2022-03-01 DIAGNOSIS — Z96651 Presence of right artificial knee joint: Secondary | ICD-10-CM | POA: Diagnosis not present

## 2022-03-01 DIAGNOSIS — M6281 Muscle weakness (generalized): Secondary | ICD-10-CM | POA: Diagnosis not present

## 2022-03-01 DIAGNOSIS — M25661 Stiffness of right knee, not elsewhere classified: Secondary | ICD-10-CM | POA: Diagnosis not present

## 2022-03-07 DIAGNOSIS — M6281 Muscle weakness (generalized): Secondary | ICD-10-CM | POA: Diagnosis not present

## 2022-03-07 DIAGNOSIS — Z96651 Presence of right artificial knee joint: Secondary | ICD-10-CM | POA: Diagnosis not present

## 2022-03-07 DIAGNOSIS — M25661 Stiffness of right knee, not elsewhere classified: Secondary | ICD-10-CM | POA: Diagnosis not present

## 2022-03-07 DIAGNOSIS — Z471 Aftercare following joint replacement surgery: Secondary | ICD-10-CM | POA: Diagnosis not present

## 2022-03-08 DIAGNOSIS — M25661 Stiffness of right knee, not elsewhere classified: Secondary | ICD-10-CM | POA: Diagnosis not present

## 2022-03-08 DIAGNOSIS — Z96651 Presence of right artificial knee joint: Secondary | ICD-10-CM | POA: Diagnosis not present

## 2022-03-08 DIAGNOSIS — M6281 Muscle weakness (generalized): Secondary | ICD-10-CM | POA: Diagnosis not present

## 2022-03-11 DIAGNOSIS — M25661 Stiffness of right knee, not elsewhere classified: Secondary | ICD-10-CM | POA: Diagnosis not present

## 2022-03-11 DIAGNOSIS — Z96651 Presence of right artificial knee joint: Secondary | ICD-10-CM | POA: Diagnosis not present

## 2022-03-11 DIAGNOSIS — M6281 Muscle weakness (generalized): Secondary | ICD-10-CM | POA: Diagnosis not present

## 2022-03-13 DIAGNOSIS — Z96651 Presence of right artificial knee joint: Secondary | ICD-10-CM | POA: Diagnosis not present

## 2022-03-13 DIAGNOSIS — M6281 Muscle weakness (generalized): Secondary | ICD-10-CM | POA: Diagnosis not present

## 2022-03-13 DIAGNOSIS — M25661 Stiffness of right knee, not elsewhere classified: Secondary | ICD-10-CM | POA: Diagnosis not present

## 2022-03-18 DIAGNOSIS — Z96651 Presence of right artificial knee joint: Secondary | ICD-10-CM | POA: Diagnosis not present

## 2022-03-18 DIAGNOSIS — M6281 Muscle weakness (generalized): Secondary | ICD-10-CM | POA: Diagnosis not present

## 2022-03-18 DIAGNOSIS — M25661 Stiffness of right knee, not elsewhere classified: Secondary | ICD-10-CM | POA: Diagnosis not present

## 2022-03-20 DIAGNOSIS — M6281 Muscle weakness (generalized): Secondary | ICD-10-CM | POA: Diagnosis not present

## 2022-03-20 DIAGNOSIS — Z96651 Presence of right artificial knee joint: Secondary | ICD-10-CM | POA: Diagnosis not present

## 2022-03-20 DIAGNOSIS — M25661 Stiffness of right knee, not elsewhere classified: Secondary | ICD-10-CM | POA: Diagnosis not present

## 2022-03-25 DIAGNOSIS — M6281 Muscle weakness (generalized): Secondary | ICD-10-CM | POA: Diagnosis not present

## 2022-03-28 DIAGNOSIS — M6281 Muscle weakness (generalized): Secondary | ICD-10-CM | POA: Diagnosis not present

## 2022-03-28 DIAGNOSIS — M25661 Stiffness of right knee, not elsewhere classified: Secondary | ICD-10-CM | POA: Diagnosis not present

## 2022-03-28 DIAGNOSIS — Z96651 Presence of right artificial knee joint: Secondary | ICD-10-CM | POA: Diagnosis not present

## 2022-04-02 DIAGNOSIS — M25661 Stiffness of right knee, not elsewhere classified: Secondary | ICD-10-CM | POA: Diagnosis not present

## 2022-04-02 DIAGNOSIS — M6281 Muscle weakness (generalized): Secondary | ICD-10-CM | POA: Diagnosis not present

## 2022-04-02 DIAGNOSIS — Z96651 Presence of right artificial knee joint: Secondary | ICD-10-CM | POA: Diagnosis not present

## 2022-04-04 DIAGNOSIS — M25661 Stiffness of right knee, not elsewhere classified: Secondary | ICD-10-CM | POA: Diagnosis not present

## 2022-04-04 DIAGNOSIS — Z96651 Presence of right artificial knee joint: Secondary | ICD-10-CM | POA: Diagnosis not present

## 2022-04-04 DIAGNOSIS — M6281 Muscle weakness (generalized): Secondary | ICD-10-CM | POA: Diagnosis not present

## 2022-04-09 DIAGNOSIS — Z96651 Presence of right artificial knee joint: Secondary | ICD-10-CM | POA: Diagnosis not present

## 2022-04-09 DIAGNOSIS — M6281 Muscle weakness (generalized): Secondary | ICD-10-CM | POA: Diagnosis not present

## 2022-04-09 DIAGNOSIS — M25661 Stiffness of right knee, not elsewhere classified: Secondary | ICD-10-CM | POA: Diagnosis not present

## 2022-04-11 DIAGNOSIS — Z96651 Presence of right artificial knee joint: Secondary | ICD-10-CM | POA: Diagnosis not present

## 2022-04-11 DIAGNOSIS — M6281 Muscle weakness (generalized): Secondary | ICD-10-CM | POA: Diagnosis not present

## 2022-04-11 DIAGNOSIS — M25661 Stiffness of right knee, not elsewhere classified: Secondary | ICD-10-CM | POA: Diagnosis not present

## 2022-04-16 DIAGNOSIS — M25661 Stiffness of right knee, not elsewhere classified: Secondary | ICD-10-CM | POA: Diagnosis not present

## 2022-04-16 DIAGNOSIS — M6281 Muscle weakness (generalized): Secondary | ICD-10-CM | POA: Diagnosis not present

## 2022-04-16 DIAGNOSIS — Z96651 Presence of right artificial knee joint: Secondary | ICD-10-CM | POA: Diagnosis not present

## 2022-04-17 DIAGNOSIS — Z0000 Encounter for general adult medical examination without abnormal findings: Secondary | ICD-10-CM | POA: Diagnosis not present

## 2022-04-17 DIAGNOSIS — Z6837 Body mass index (BMI) 37.0-37.9, adult: Secondary | ICD-10-CM | POA: Diagnosis not present

## 2022-04-17 DIAGNOSIS — G4733 Obstructive sleep apnea (adult) (pediatric): Secondary | ICD-10-CM | POA: Diagnosis not present

## 2022-04-17 DIAGNOSIS — Z86711 Personal history of pulmonary embolism: Secondary | ICD-10-CM | POA: Diagnosis not present

## 2022-04-17 DIAGNOSIS — R202 Paresthesia of skin: Secondary | ICD-10-CM | POA: Diagnosis not present

## 2022-04-17 DIAGNOSIS — Z1331 Encounter for screening for depression: Secondary | ICD-10-CM | POA: Diagnosis not present

## 2022-04-17 DIAGNOSIS — E1142 Type 2 diabetes mellitus with diabetic polyneuropathy: Secondary | ICD-10-CM | POA: Diagnosis not present

## 2022-04-17 DIAGNOSIS — Z85038 Personal history of other malignant neoplasm of large intestine: Secondary | ICD-10-CM | POA: Diagnosis not present

## 2022-04-17 DIAGNOSIS — D6869 Other thrombophilia: Secondary | ICD-10-CM | POA: Diagnosis not present

## 2022-04-18 DIAGNOSIS — Z96651 Presence of right artificial knee joint: Secondary | ICD-10-CM | POA: Diagnosis not present

## 2022-04-18 DIAGNOSIS — M25661 Stiffness of right knee, not elsewhere classified: Secondary | ICD-10-CM | POA: Diagnosis not present

## 2022-04-18 DIAGNOSIS — M6281 Muscle weakness (generalized): Secondary | ICD-10-CM | POA: Diagnosis not present

## 2022-04-25 DIAGNOSIS — Z96651 Presence of right artificial knee joint: Secondary | ICD-10-CM | POA: Diagnosis not present

## 2022-04-25 DIAGNOSIS — M25661 Stiffness of right knee, not elsewhere classified: Secondary | ICD-10-CM | POA: Diagnosis not present

## 2022-04-25 DIAGNOSIS — M6281 Muscle weakness (generalized): Secondary | ICD-10-CM | POA: Diagnosis not present

## 2022-05-02 DIAGNOSIS — Z96651 Presence of right artificial knee joint: Secondary | ICD-10-CM | POA: Diagnosis not present

## 2022-05-02 DIAGNOSIS — M6281 Muscle weakness (generalized): Secondary | ICD-10-CM | POA: Diagnosis not present

## 2022-05-02 DIAGNOSIS — M25661 Stiffness of right knee, not elsewhere classified: Secondary | ICD-10-CM | POA: Diagnosis not present

## 2022-06-04 DIAGNOSIS — Z471 Aftercare following joint replacement surgery: Secondary | ICD-10-CM | POA: Diagnosis not present

## 2022-06-04 DIAGNOSIS — M25561 Pain in right knee: Secondary | ICD-10-CM | POA: Diagnosis not present

## 2022-06-04 DIAGNOSIS — Z96651 Presence of right artificial knee joint: Secondary | ICD-10-CM | POA: Diagnosis not present

## 2022-07-02 DIAGNOSIS — Z85038 Personal history of other malignant neoplasm of large intestine: Secondary | ICD-10-CM | POA: Diagnosis not present

## 2022-07-04 DIAGNOSIS — R001 Bradycardia, unspecified: Secondary | ICD-10-CM | POA: Diagnosis not present

## 2022-07-08 ENCOUNTER — Ambulatory Visit: Payer: Medicare PPO | Attending: Internal Medicine | Admitting: Internal Medicine

## 2022-07-08 ENCOUNTER — Ambulatory Visit: Payer: Medicare PPO | Attending: Internal Medicine

## 2022-07-08 ENCOUNTER — Encounter: Payer: Self-pay | Admitting: Internal Medicine

## 2022-07-08 VITALS — BP 118/80 | HR 54 | Ht 74.0 in | Wt 281.2 lb

## 2022-07-08 DIAGNOSIS — R001 Bradycardia, unspecified: Secondary | ICD-10-CM

## 2022-07-08 DIAGNOSIS — R9431 Abnormal electrocardiogram [ECG] [EKG]: Secondary | ICD-10-CM

## 2022-07-08 NOTE — Progress Notes (Unsigned)
Enrolled for [employer] to mail a ZIO XT long term holter monitor to the patients address on file.  

## 2022-07-08 NOTE — Progress Notes (Signed)
Cardiology Office Note:    Date:  07/08/2022   ID:  Connor King, DOB [birth year], MRN [id number]  PCP:  Lavone Orn, [place] Providers Cardiologist:  None     Referring MD: Lavone Orn, MD   No chief complaint on file. Bradycardia  History of Present Illness:    Connor King is a 71 y.o. male with a hx of DM2 A1c 6.0%, BMI 37, Stage I colonc CA s/p sigmoid colectomy w/ primary anastomosis/small bowel resection, basal cell carcinoma, VT/PE, severe OSA on CPAP,  referral to cardiology for ?CHB, bradycardia. The referral is cut off and can't fully be seen. There's no ecg. HR noted to be 54, narrow complex QRS 84.  He notes Hrs in the 39s. He has some LH. No syncope. No heart catheterization.  He works 2-3x per week. No limitations , no SOB or CP. No fluttering  He has normal blood pressure  Family Hx: parents had lung CA. No heart disease  Social Hx: no smoking  04/17/2022 TC 113 mg/dL  CALC LDL 44 mg/dL A1c 6.0  TTE 2017: normal LV/RV fxn. No valve dx. Past Medical History:  Diagnosis Date   Anticoagulant long-term use    xarelto---  managed by pcp (dr Lavone Orn)  takes for hx dvt/ pe's   Diabetes mellitus type 2, diet-controlled ([place])    followed by pcp--- per pt does not check cbg at home   First degree heart block    History of colon cancer 06-23-2019  per pt no recurrance   Stage I  --  02-10-2002  s/p  sigmoid colectomy with primary anastomosis/ small bowel resection (diverticulitis)/ appendectomy   History of diverticulitis of colon    History of DVT of lower extremity    11/ 2002  right lower leg   History of kidney stones    History of pulmonary embolus (PE)    09/ 2017  bilateral PE's--- resolved--- long term xarelto   Hyperlipidemia    OSA on CPAP    Ureteral calculi    left    Past Surgical History:  Procedure Laterality Date   CHOLECYSTECTOMY  07/25/2012   Procedure: LAPAROSCOPIC CHOLECYSTECTOMY WITH INTRAOPERATIVE  CHOLANGIOGRAM;  Surgeon: Edward Jolly, MD;  Location: [facility];  Service: General;  Laterality: N/A;   COLONOSCOPY WITH PROPOFOL N/A 04/01/2017   Procedure: COLONOSCOPY WITH PROPOFOL;  Surgeon: Ronald Lobo, MD;  Location: [facility];  Service: Endoscopy;  Laterality: N/A;   CYSTOSCOPY/RETROGRADE/URETEROSCOPY/STONE EXTRACTION WITH BASKET Left 06/25/2019   Procedure: CYSTOSCOPY/RETROGRADE/URETEROSCOPY/STONE EXTRACTION WITH BASKET/ POSSIBLE STENT PLACEMENT;  Surgeon: Kathie Rhodes, MD;  Location: [facility];  Service: Urology;  Laterality: Left;   EXTRACORPOREAL SHOCK WAVE LITHOTRIPSY Left 04/26/2019   Procedure: EXTRACORPOREAL SHOCK WAVE LITHOTRIPSY (ESWL);  Surgeon: Ceasar Mons, MD;  Location: [facility];  Service: Urology;  Laterality: Left;   LAPAROSCOPIC ASSISTED VENTRAL HERNIA REPAIR  02-21-2005    dr Dalbert Batman  '@[facility]'$    w/  lysis adhesions   LITHOTRIPSY     over30 years ago   PARTIAL COLECTOMY  02-10-2002   dr Dalbert Batman '@[facility]'$    Sigmoid colectomy w/ primary anastomosis/  small bowel resection for meckel's diverticulitis/  appendectomy   TENDON REPAIR Right 2011   open right knee   TOTAL KNEE ARTHROPLASTY Left 08-09-2009  dr Berenice Primas  '@[facility]'$     Current Medications: No outpatient medications have been marked as taking for the 07/08/22 encounter (Appointment) with Janina Mayo, MD.  Allergies:   Patient has no known allergies.   Social History   Socioeconomic History   Marital status: Married    Spouse name: Not on file   Number of children: Not on file   Years of education: Not on file   Highest education level: Not on file  Occupational History   Not on file  Tobacco Use   Smoking status: Never   Smokeless tobacco: Never  Vaping Use   Vaping Use: Never used  Substance and Sexual Activity   Alcohol use: No   Drug use: Never   Sexual activity: Not on file  Other Topics Concern   Not on file  Social History Narrative   Not on file   Social  Determinants of Health   Financial Resource Strain: Not on file  Food Insecurity: Not on file  Transportation Needs: Not on file  Physical Activity: Not on file  Stress: Not on file  Social Connections: Not on file     Family History: Per above  ROS:   Please see the history of present illness.     All other systems reviewed and are negative.  EKGs/Labs/Other Studies Reviewed:    The following studies were reviewed today:   EKG:  EKG is  ordered today.  The ekg ordered today demonstrates   11/6- Sinus bradycardia, possible sinoatrial exit block  Recent Labs: No results found for requested labs within last 365 days.   Recent Lipid Panel No results found for: "CHOL", "TRIG", "HDL", "CHOLHDL", "VLDL", "LDLCALC", "LDLDIRECT"   Risk Assessment/Calculations:         Physical Exam:    VS:   Vitals:   07/08/22 1121  BP: 118/80  Pulse: (!) 54  SpO2: 97%     Wt Readings from Last 3 Encounters:  06/25/19 (!) 319 lb (144.7 kg)  04/26/19 (!) 312 lb (141.5 kg)  04/01/17 (!) 329 lb (149.2 kg)     GEN:  Well nourished, well developed in no acute distress HEENT: Normal NECK: No JVD; No carotid bruits LYMPHATICS: No lymphadenopathy CARDIAC: RRR, no murmurs, rubs, gallops RESPIRATORY:  Clear to auscultation without rales, wheezing or rhonchi  ABDOMEN: Soft, non-tender, non-distended MUSCULOSKELETAL:  No edema; No deformity  SKIN: Warm and dry NEUROLOGIC:  Alert and oriented x 3 PSYCHIATRIC:  Normal affect   ASSESSMENT:    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 Will obtain a ziopatch to assess his rhythm longitudinally and get a sense of chronotropic response. Will have low threshold hold to refer to EP for possible SSS.  HLD: on lipitor 10 mg daily  Hx DVT/PE: on xarelto 20 mg daily  PLAN:    In order of problems listed above:  7 day ziopatch TTE Follow up 6 months        Medication Adjustments/Labs and Tests  Ordered: Current medicines are reviewed at length with the patient today.  Concerns regarding medicines are outlined above.  No orders of the defined types were placed in this encounter.  No orders of the defined types were placed in this encounter.   There are no Patient Instructions on file for this visit.   Signed, Janina Mayo, MD  07/08/2022 8:14 AM    [place]

## 2022-07-08 NOTE — Patient Instructions (Signed)
Medication Instructions:  No Changes In Medications at this time.  *If you need a refill on your cardiac medications before your next appointment, please call your pharmacy*  Lab Work: None Ordered At This Time.  If you have labs (blood work) drawn today and your tests are completely normal, you will receive your results only by: [place] (if you have MyChart) OR A paper copy in the mail If you have any lab test that is abnormal or we need to change your treatment, we will call you to review the results.  Testing/Procedures: Your physician has requested that you have an echocardiogram. Echocardiography is a painless test that uses sound waves to create images of your heart. It provides your doctor with information about the size and shape of your heart and how well your heart's chambers and valves are working. You may receive an ultrasound enhancing agent through an IV if needed to better visualize your heart during the echo.This procedure takes approximately one hour. There are no restrictions for this procedure. This will take place at the [address], [address].    ZIO XT- Long Term Monitor Instructions   Your physician has requested you wear your ZIO patch monitor___7____days.   This is a single patch monitor.  Ir[employer] supplies one patch monitor per enrollment.  Additional stickers are not available.   Please do not apply patch if you will be having a Nuclear Stress Test, Echocardiogram, Cardiac CT, MRI, or Chest Xray during the time frame you would be wearing the monitor. The patch cannot be worn during these tests.  You cannot remove and re-apply the ZIO XT patch monitor.   Your ZIO patch monitor will be sent USPS Priority mail from [facility] directly to your home address. The monitor may also be mailed to a PO BOX if home delivery is not available.   It may take 3-5 days to receive your monitor after you have been enrolled.   Once you have received you monitor,  please review enclosed instructions.  Your monitor has already been registered assigning a specific monitor serial # to you.   Applying the monitor   Shave hair from upper left chest.   Hold abrader disc by orange tab.  Rub abrader in 40 strokes over left upper chest as indicated in your monitor instructions.   Clean area with 4 enclosed alcohol pads .  Use all pads to assure are is cleaned thoroughly.  Let dry.   Apply patch as indicated in monitor instructions.  Patch will be place under collarbone on left side of chest with arrow pointing upward.   Rub patch adhesive wings for 2 minutes.Remove white label marked "1".  Remove white label marked "2".  Rub patch adhesive wings for 2 additional minutes.   While looking in a mirror, press and release button in center of patch.  A small green light will flash 3-4 times .  This will be your only indicator the monitor has been turned on.     Do not shower for the first 24 hours.  You may shower after the first 24 hours.   Press button if you feel a symptom. You will hear a small click.  Record Date, Time and Symptom in the Patient Log Book.   When you are ready to remove patch, follow instructions on last 2 pages of Patient Log Book.  Stick patch monitor onto last page of Patient Log Book.   Place Patient Log Book in [facility]  box.  Use locking tab on box and tape box closed securely.  The [employer] has [employer] on it.  Please place in mailbox as soon as possible.  Your physician should have your test results approximately 7 days after the monitor has been mailed back to [facility].   Call [place] at [phone] if you have questions regarding your ZIO XT patch monitor.  Call them immediately if you see an orange light blinking on your monitor.   If your monitor falls off in less than 4 days contact our Monitor department at [phone].  If your monitor becomes loose or falls off after 4 days call  [employer] at [phone] for suggestions on securing your monitor.    Follow-Up: At [facility], you and your health needs are our priority.  As part of our continuing mission to provide you with exceptional heart care, we have created designated Provider Care Teams.  These Care Teams include your primary Cardiologist (physician) and Advanced Practice Providers (APPs -  Physician Assistants and Nurse Practitioners) who all work together to provide you with the care you need, when you need it.  Your next appointment:   6 month(s)  The format for your next appointment:   In Person  Provider:   Janina Mayo, MD

## 2022-07-12 DIAGNOSIS — R001 Bradycardia, unspecified: Secondary | ICD-10-CM

## 2022-07-12 DIAGNOSIS — R9431 Abnormal electrocardiogram [ECG] [EKG]: Secondary | ICD-10-CM

## 2022-07-29 ENCOUNTER — Telehealth: Payer: Self-pay | Admitting: Internal Medicine

## 2022-07-29 DIAGNOSIS — H31003 Unspecified chorioretinal scars, bilateral: Secondary | ICD-10-CM | POA: Diagnosis not present

## 2022-07-29 DIAGNOSIS — R001 Bradycardia, unspecified: Secondary | ICD-10-CM | POA: Diagnosis not present

## 2022-07-29 DIAGNOSIS — I459 Conduction disorder, unspecified: Secondary | ICD-10-CM

## 2022-07-29 DIAGNOSIS — R9431 Abnormal electrocardiogram [ECG] [EKG]: Secondary | ICD-10-CM | POA: Diagnosis not present

## 2022-07-29 NOTE — Telephone Encounter (Signed)
Spoke to [place] with [employer] with critical result:  Patient worse Zio for 7 days During time he had 802 episodes of complete heart block HR 38 lowest for 7 seconds-11/15-507 AM  MD aware and report posted in Epic.

## 2022-07-29 NOTE — Telephone Encounter (Signed)
Caller is reporting abnormal reading.  Ref# [id number].

## 2022-07-30 NOTE — Telephone Encounter (Signed)
Attempted to call patient, left message for patient to call back to office.   Per Dr. Harl Bowie- refer to EP.

## 2022-07-30 NOTE — Telephone Encounter (Signed)
Spoke with patient and made him aware. Referral placed to Electrophysiology and message sent to scheduler.   Advised patient to call back to office with any issues, questions, or concerns. Patient verbalized understanding.

## 2022-08-05 DIAGNOSIS — M25561 Pain in right knee: Secondary | ICD-10-CM | POA: Diagnosis not present

## 2022-08-06 ENCOUNTER — Ambulatory Visit (HOSPITAL_COMMUNITY): Payer: Medicare PPO | Attending: Internal Medicine

## 2022-08-06 DIAGNOSIS — R001 Bradycardia, unspecified: Secondary | ICD-10-CM | POA: Insufficient documentation

## 2022-08-06 DIAGNOSIS — R9431 Abnormal electrocardiogram [ECG] [EKG]: Secondary | ICD-10-CM | POA: Insufficient documentation

## 2022-08-06 LAB — ECHOCARDIOGRAM COMPLETE
Area-P 1/2: 4.8 cm2
S' Lateral: 3 cm

## 2022-08-08 NOTE — Telephone Encounter (Signed)
Patient scheduled to see EP on 08/28/22- will forward to MD to make aware.

## 2022-08-16 DIAGNOSIS — Z86718 Personal history of other venous thrombosis and embolism: Secondary | ICD-10-CM | POA: Diagnosis not present

## 2022-08-16 DIAGNOSIS — E1142 Type 2 diabetes mellitus with diabetic polyneuropathy: Secondary | ICD-10-CM | POA: Diagnosis not present

## 2022-08-16 DIAGNOSIS — G250 Essential tremor: Secondary | ICD-10-CM | POA: Diagnosis not present

## 2022-08-16 DIAGNOSIS — G4733 Obstructive sleep apnea (adult) (pediatric): Secondary | ICD-10-CM | POA: Diagnosis not present

## 2022-08-16 DIAGNOSIS — I459 Conduction disorder, unspecified: Secondary | ICD-10-CM | POA: Diagnosis not present

## 2022-08-16 DIAGNOSIS — E1169 Type 2 diabetes mellitus with other specified complication: Secondary | ICD-10-CM | POA: Diagnosis not present

## 2022-08-28 ENCOUNTER — Institutional Professional Consult (permissible substitution): Payer: Medicare PPO | Admitting: Cardiovascular Disease

## 2022-09-16 ENCOUNTER — Institutional Professional Consult (permissible substitution): Payer: Medicare PPO | Admitting: Internal Medicine

## 2022-10-02 ENCOUNTER — Other Ambulatory Visit (INDEPENDENT_AMBULATORY_CARE_PROVIDER_SITE_OTHER): Payer: Medicare PPO

## 2022-10-02 ENCOUNTER — Encounter: Payer: Self-pay | Admitting: Internal Medicine

## 2022-10-02 ENCOUNTER — Ambulatory Visit: Payer: Medicare PPO | Attending: Internal Medicine | Admitting: Internal Medicine

## 2022-10-02 VITALS — BP 114/68 | HR 54 | Ht 74.0 in | Wt 272.0 lb

## 2022-10-02 DIAGNOSIS — R001 Bradycardia, unspecified: Secondary | ICD-10-CM

## 2022-10-02 DIAGNOSIS — R072 Precordial pain: Secondary | ICD-10-CM | POA: Diagnosis not present

## 2022-10-02 DIAGNOSIS — Z01812 Encounter for preprocedural laboratory examination: Secondary | ICD-10-CM | POA: Diagnosis not present

## 2022-10-02 NOTE — Progress Notes (Signed)
ELECTROPHYSIOLOGY CONSULT NOTE  Patient ID: Connor King, MRN: [id number], DOB/AGE: Jan 10, 1951 72 y.o. Admit date: (Not on file) Date of Consult: 10/02/2022  Primary Physician: Delma Officer, PA Primary Cardiologist: MBr     Connor King is a 72 y.o. male who is being seen today for the evaluation of bradycardia at the request of MBr.    HPI Connor King is a 72 y.o. male  Referred for bradycardia and worening exercise intolerance and fatigue with LH over the last 3-4 weeks  Has been noted to have bradycardia by his PCP.  An ECG prompted referral to cardiology repeated which showed second-degree AV block type I.  Remote ECGs had shown sinus rhythm but with first-degree AV block and a history of bradycardia with heart rates in the 50s in 2020, no ECGs were available at that time.  Concurrent with this fatigue has been introduction of Cymbalta for neuropathy.  Event recorder was placed after the initial bradycardia and was associated with reasonable heart rate excursion and indeed average heart rates during exercise times of about 100.  The report is woefully incomplete  No known coronary disease.  CT scans were reviewed without comments about coronary artery calcification history of DVT and PE the former about 2 decades ago the latter about 6 years ago since which time he has been on full dose rivaroxaban I would renew to get  Severe sleep apnea-treated   DATE TEST EF   12/23 Echo   60-65 %         Date Cr K Hgb  11/23 1.2 4.2 13.6         Interval 50 pound weight loss using Mounjaro   Past Medical History:  Diagnosis Date   Anticoagulant long-term use    xarelto---  managed by pcp (dr Kirby Funk)  takes for hx dvt/ pe's   Diabetes mellitus type 2, diet-controlled (HCC)    followed by pcp--- per pt does not check cbg at home   First degree heart block    History of colon cancer 06-23-2019  per pt no recurrance   Stage I  --  02-10-2002  s/p   sigmoid colectomy with primary anastomosis/ small bowel resection (diverticulitis)/ appendectomy   History of diverticulitis of colon    History of DVT of lower extremity    11/ 2002  right lower leg   History of kidney stones    History of pulmonary embolus (PE)    09/ 2017  bilateral PE's--- resolved--- long term xarelto   Hyperlipidemia    OSA on CPAP    Ureteral calculi    left      Surgical History:  Past Surgical History:  Procedure Laterality Date   CHOLECYSTECTOMY  07/25/2012   Procedure: LAPAROSCOPIC CHOLECYSTECTOMY WITH INTRAOPERATIVE CHOLANGIOGRAM;  Surgeon: Mariella Saa, MD;  Location: [facility];  Service: General;  Laterality: N/A;   COLONOSCOPY WITH PROPOFOL N/A 04/01/2017   Procedure: COLONOSCOPY WITH PROPOFOL;  Surgeon: Bernette Redbird, MD;  Location: [facility];  Service: Endoscopy;  Laterality: N/A;   CYSTOSCOPY/RETROGRADE/URETEROSCOPY/STONE EXTRACTION WITH BASKET Left 06/25/2019   Procedure: CYSTOSCOPY/RETROGRADE/URETEROSCOPY/STONE EXTRACTION WITH BASKET/ POSSIBLE STENT PLACEMENT;  Surgeon: Ihor Gully, MD;  Location: [facility];  Service: Urology;  Laterality: Left;   EXTRACORPOREAL SHOCK WAVE LITHOTRIPSY Left 04/26/2019   Procedure: EXTRACORPOREAL SHOCK WAVE LITHOTRIPSY (ESWL);  Surgeon: Rene Paci, MD;  Location: [facility];  Service: Urology;  Laterality: Left;   LAPAROSCOPIC ASSISTED  VENTRAL HERNIA REPAIR  02-21-2005    dr Derrell Lolling     w/  lysis adhesions   LITHOTRIPSY     over30 years ago   PARTIAL COLECTOMY  02-10-2002   dr Derrell Lolling    Sigmoid colectomy w/ primary anastomosis/  small bowel resection for meckel's diverticulitis/  appendectomy   TENDON REPAIR Right 2011   open right knee   TOTAL KNEE ARTHROPLASTY Left 08-09-2009  dr graves       Home Meds: Current Meds  Medication Sig   acetaminophen (TYLENOL) 500 MG tablet Take 1,000 mg by mouth every 6 (six) hours as needed for mild pain or headache.   amoxicillin  (AMOXIL) 500 MG tablet 4 tabs prior to appointment   atorvastatin (LIPITOR) 10 MG tablet Take 10 mg by mouth daily.   cholecalciferol (VITAMIN D) 1000 units tablet Take 1,000 Units by mouth daily.   Cyanocobalamin (VITAMIN B 12 PO) Take 1,000 mcg by mouth daily.   DULoxetine (CYMBALTA) 60 MG capsule Take 60 mg by mouth daily.   gabapentin (NEURONTIN) 300 MG capsule Take 600 mg by mouth daily.   metFORMIN (GLUCOPHAGE-XR) 500 MG 24 hr tablet Take 500 mg by mouth daily.   MOUNJARO 5 MG/0.5ML Pen Inject 5 mg into the skin once a week.   rivaroxaban (XARELTO) 20 MG TABS tablet Take 20 mg by mouth daily.    Allergies: No Known Allergies  Social History   Socioeconomic History   Marital status: Married    Spouse name: Not on file   Number of children: Not on file   Years of education: Not on file   Highest education level: Not on file  Occupational History   Not on file  Tobacco Use   Smoking status: Never   Smokeless tobacco: Never  Vaping Use   Vaping Use: Never used  Substance and Sexual Activity   Alcohol use: No   Drug use: Never   Sexual activity: Not on file  Other Topics Concern   Not on file  Social History Narrative   Not on file   Social Determinants of Health   Financial Resource Strain: Not on file  Food Insecurity: Not on file  Transportation Needs: Not on file  Physical Activity: Not on file  Stress: Not on file  Social Connections: Not on file  Intimate Partner Violence: Not on file     History reviewed. No pertinent family history.   ROS:  Please see the history of present illness.     All other systems reviewed and negative.    Physical Exam:  Blood pressure 114/68, pulse (!) 54, height  (1.88 m), weight 272 lb (123.4 kg), SpO2 99 %. General: Well developed, obese male in no acute distress. Head: Normocephalic, atraumatic, sclera non-icteric, no xanthomas, nares are without discharge. EENT: normal  Lymph Nodes:  none Neck: Negative for  carotid bruits. JVD not elevated. Back:without scoliosis kyphosis Lungs: Clear bilaterally to auscultation without wheezes, rales, or rhonchi. Breathing is unlabored. Heart: irregular RR with S1 S2. No murmur . No rubs, or gallops appreciated. Abdomen: Soft, non-tender, non-distended with normoactive bowel sounds. No hepatomegaly. No rebound/guarding. No obvious abdominal masses. Msk:  Strength and tone appear normal for age. Extremities: No clubbing or cyanosis. No + edema.  Distal pedal pulses are 2+ and equal bilaterally. Skin: Warm and Dry Neuro: Alert and oriented X 3. CN III-XII intact Grossly normal sensory and motor function . Psych:  Responds to questions appropriately with a normal affect.  EKG: Sinus rhythm at 65  I am not quite sure of the degree of block.  There are patterns that suggest second-degree AV block type I with significant first-degree AV block on the conducted beats, however, there is no grouped beating for most of the beats and the RR interval is regular suggesting complete heart block at the level of the AV node here is some degree of irregularity suggesting some conduction  Walking in the office with a pulse oximeter heart rate went rapidly and sustained it 100 or so  Assessment and Plan:   AV nodal conduction system disease progressive  Fatigue and dyspnea  Obesity on Mounjaro  History of pulmonary embolism on rivaroxaban  The patient has known first-degree AV block dating back a few years with bradycardia also noted on vital sign reports but not on tracings with rates in the 50s is much as 3 years ago.  His tracings are concerning for symptomatic AV nodal block mostly with Wenckebach, but the fact that his heart rate increased to 100 with exercise, consistent with what was seen on the event recorder suggests that chronotropic competence and AV nodal conduction disease may not be the primary issue here.  The fact that the Cymbalta is new and can  cause fatigue raises that specter.  Will have him hold it for few weeks and see what happens   progressive conduction system disease with the attendant symptoms might also be a manifestation of ischemia.  We will plan to use an AT monitor to follow his heart rhythm looking for significant bradycardia arrhythmias allowing that they may be symptomatic at this juncture but not clearly so.  Undertake a CTA to exclude coronary disease  Sherryl Manges

## 2022-10-02 NOTE — Progress Notes (Unsigned)
ZIO AT serial # [id number] from office inventory applied to patient.

## 2022-10-02 NOTE — Patient Instructions (Signed)
Medication Instructions:  Your physician has recommended you make the following change in your medication:   ** Hold Cymbalta   *If you need a refill on your cardiac medications before your next appointment, please call your pharmacy*   Lab Work: None ordered.  If you have labs (blood work) drawn today and your tests are completely normal, you will receive your results only by: [place] (if you have MyChart) OR A paper copy in the mail If you have any lab test that is abnormal or we need to change your treatment, we will call you to review the results.   Testing/Procedures: Your physician has requested that you have cardiac CT. Cardiac computed tomography (CT) is a painless test that uses an x-ray machine to take clear, detailed pictures of your heart. For further information please visit [email or web address].[email or web address]. Please follow instruction sheet as given.  ZIO AT Long term monitor-Live Telemetry  Your physician has requested you wear a ZIO patch monitor for 14 days.  This is a single patch monitor. Ir[employer] supplies one patch monitor per enrollment. Additional  stickers are not available.  Please do not apply patch if you will be having a Nuclear Stress Test, Echocardiogram, Cardiac CT, MRI,  or Chest Xray during the period you would be wearing the monitor. The patch cannot be worn during  these tests. You cannot remove and re-apply the ZIO AT patch monitor.  Your ZIO patch monitor will be mailed 3 day USPS to your address on file. It may take 3-5 days to  receive your monitor after you have been enrolled.  Once you have received your monitor, please review the enclosed instructions. Your monitor has  already been registered assigning a specific monitor serial # to you.   Billing and Patient Assistance Program information  Connor King has been supplied with any insurance information on record for billing. [employer] offers a sliding scale Patient Assistance Program for patients without  insurance, or whose  insurance does not completely cover the cost of the ZIO patch monitor. You must apply for the  Patient Assistance Program to qualify for the discounted rate. To apply, call [employer] at [phone],  select option 4, select option 2 , ask to apply for the Patient Assistance Program, (you can request an  interpreter if needed). [employer] will ask your household income and how many people are in your  household. [employer] will quote your out-of-pocket cost based on this information. They will also be able  to set up a 12 month interest free payment plan if needed.  Applying the monitor   Shave hair from upper left chest.  Hold the abrader disc by orange tab. Rub the abrader in 40 strokes over left upper chest as indicated in  your monitor instructions.  Clean area with 4 enclosed alcohol pads. Use all pads to ensure the area is cleaned thoroughly. Let  dry.  Apply patch as indicated in monitor instructions. Patch will be placed under collarbone on left side of  chest with arrow pointing upward.  Rub patch adhesive wings for 2 minutes. Remove the white label marked "1". Remove the white label  marked "2". Rub patch adhesive wings for 2 additional minutes.  While looking in a mirror, press and release button in center of patch. A small green light will flash 3-4  times. This will be your only indicator that the monitor has been turned on.  Do not shower for the first 24 hours. You may shower after the first 24 hours.  Press the button if you feel a symptom. You will hear a small click. Record Date, Time and Symptom in  the Patient Log.   Starting the Gateway  In your kit there is a Hydrographic surveyor box the size of a cellphone. This is Airline pilot. It transmits all your  recorded data to [facility]. This box must always stay within 10 feet of you. Open the box and push the *  button. There will be a light that blinks orange and then green a few times. When the light stops   blinking, the Gateway is connected to the ZIO patch. Call [employer] at [phone] to confirm your monitor is transmitting.  Returning your monitor  Remove your patch and place it inside the [place]. In the lower half of the Gateway there is a white  bag with prepaid postage on it. Place Gateway in bag and seal. Mail package back to [place] as soon as  possible. Your physician should have your final report approximately 7 days after you have mailed back  your monitor. Call [place] at [phone] if you have questions regarding your ZIO AT  patch monitor. Call them immediately if you see an orange light blinking on your monitor.  If your monitor falls off in less than 4 days, contact our Monitor department at [phone]. If your  monitor becomes loose or falls off after 4 days call [employer] at [phone] for suggestions on  securing your monitor    \    Follow-Up: At [facility], you and your health needs are our priority.  As part of our continuing mission to provide you with exceptional heart care, we have created designated Provider Care Teams.  These Care Teams include your primary Cardiologist (physician) and Advanced Practice Providers (APPs -  Physician Assistants and Nurse Practitioners) who all work together to provide you with the care you need, when you need it.  We recommend signing up for the patient portal called "MyChart".  Sign up information is provided on this After Visit Summary.  MyChart is used to connect with patients for Virtual Visits (Telemedicine).  Patients are able to view lab/test results, encounter notes, upcoming appointments, etc.  Non-urgent messages can be sent to your provider as well.   To learn more about what you can do with MyChart, go to [email or web address].    Your next appointment:   4 weeks with Dr [name] - Dr Olin Pia scheduler will call you to set up appointment  Other Instructions   Your  cardiac CT will be scheduled at one of the below locations:   [facility] [address] [place], [place] [address] [phone]  If scheduled at [facility], please arrive at the [facility] and Children's Entrance (Entrance C2) of [facility] 30 minutes prior to test start time. You can use the FREE valet parking offered at entrance C (encouraged to control the heart rate for the test)  Proceed to the [facility] Radiology Department (first floor) to check-in and test prep.  All radiology patients and guests should use entrance C2 at  [facility], accessed from [facility], even though the hospital's physical address listed is [address].     Please follow these instructions carefully (unless otherwise directed):  Hold all erectile dysfunction medications at least 3 days (72 hrs) prior to test. (Ie viagra, cialis, sildenafil, tadalafil, etc) We will administer nitroglycerin during this exam.   On the Night Before the  Test: Be sure to Drink plenty of water. Do not consume any caffeinated/decaffeinated beverages or chocolate 12 hours prior to your test. Do not take any antihistamines 12 hours prior to your test.   On the Day of the Test: Drink plenty of water until 1 hour prior to the test. Do not eat any food 1 hour prior to test. You may take your regular medications prior to the test.  Take metoprolol (Lopressor) two hours prior to test. HOLD Furosemide/Hydrochlorothiazide morning of the test. FEMALES- please wear underwire-free bra if available, avoid dresses & tight clothing        After the Test: Drink plenty of water. After receiving IV contrast, you may experience a mild flushed feeling. This is normal. On occasion, you may experience a mild rash up to 24 hours after the test. This is not dangerous. If this occurs, you can take Benadryl 25 mg and increase your fluid intake. If you experience trouble breathing,  this can be serious. If it is severe call 911 IMMEDIATELY. If it is mild, please call our office. If you take any of these medications: Glipizide/Metformin, Avandament, Glucavance, please do not take 48 hours after completing test unless otherwise instructed.  We will call to schedule your test 2-4 weeks out understanding that some insurance companies will need an authorization prior to the service being performed.   For non-scheduling related questions, please contact the cardiac imaging nurse navigator should you have any questions/concerns: Marchia Bond, Cardiac Imaging Nurse Navigator Gordy Clement, Cardiac Imaging Nurse Navigator [facility] Direct Office Dial: [phone]   For scheduling needs, including cancellations and rescheduling, please call [place], [phone].

## 2022-10-03 ENCOUNTER — Telehealth: Payer: Self-pay | Admitting: Internal Medicine

## 2022-10-03 NOTE — Telephone Encounter (Signed)
   Cardiac Monitor Alert  Date of alert:  10/03/2022   Patient Name: Connor King  DOB: May 02, 1951  MRN: [id number]   [place] Cardiologist: Janina Mayo, [place] [facility] EP: Jolyn Nap, MD    Monitor Information: Long Term Monitor-Live Telemetry [ZioAT]  Reason:  Second-degree heart block, type 1 AND complete heart block (multiple alerts) Ordering provider:  Jolyn Nap, MD   Alert Complete Heart Block 2nd degree AV Block, Type I This is the 1st alert for this rhythm.   Next Cardiology Appointment   Date: Approximately 10/30/22   Provider:  [name]  The patient was contacted today. He is symptomatic. He reports the following symptoms: unchanged from what he has been feeling for 3-4 weeks prior to this appt. He describes as activity intolerance and fatigue. States he did not feel anything different last night from what he has been experiencing and states he drove home, ate supper, and watched television prior to bed. No excessive physical activity.  Arrhythmia, symptoms and history reviewed with [name], MD.   Plan:  Spoke with Dr [name] who reviewed alert EKG strips, reviewed [name]'s note and reached out to Dr [name] via phone. Per Dr [name], he would like monitor parameters to be adjusted to only alert for pause greater than 4 seconds and to skip calls/alerts for heart block since these are known conditions now. Advise passed to [name] in monitors who is calling to make changes today. Otherwise, no changes to pt's plan of care at this time.   Ma Hillock, RN  10/03/2022 10:12 AM

## 2022-10-05 ENCOUNTER — Telehealth: Payer: Self-pay | Admitting: Cardiology

## 2022-10-05 NOTE — Telephone Encounter (Signed)
Received outpatient page from [facility] regarding trigger for high degree AVB of 49-52 bpm for a 6 second episode at 1:58pm. 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 He is currently wearing monitor and seen by EP. He reports [place] Felt short of breath and a fluttering sensation in his chest with that. He was referred to EP per Dr. Phineas Inches. I discussed the scenario and most recent cardiac monitor findings with Dr. Harl Bowie. For now, asked that the patient refrain from driving until results of monitor are resulted and recommendations received from EP MD. Discussed ER precautions with the patient regarding symptoms, near or frank syncope. He voiced understanding and thanked me for callback. Will route to ordering MD/covering RN to follow up with patient this week.

## 2022-10-06 ENCOUNTER — Telehealth: Payer: Self-pay | Admitting: Internal Medicine

## 2022-10-06 NOTE — Telephone Encounter (Signed)
Contacted by [employer] regarding transient 2:1 AV block identified for patient lasting seconds.  Company called patient and he was asymptomatic and stable.  No additional management at this time.

## 2022-10-07 ENCOUNTER — Institutional Professional Consult (permissible substitution): Payer: Medicare PPO | Admitting: Cardiovascular Disease

## 2022-10-08 ENCOUNTER — Encounter: Payer: Self-pay | Admitting: Internal Medicine

## 2022-10-08 NOTE — Telephone Encounter (Signed)
Dr [name] has reviewed monitor strips and pt has been advised he may drive an perform his normal physical activities in a MyChart encounter.  Please see that encounter for complete details.

## 2022-10-16 ENCOUNTER — Telehealth (HOSPITAL_COMMUNITY): Payer: Self-pay | Admitting: Emergency Medicine

## 2022-10-16 NOTE — Telephone Encounter (Signed)
Reaching out to patient to offer assistance regarding upcoming cardiac imaging study; pt verbalizes understanding of appt date/time, parking situation and where to check in, pre-test NPO status and medications ordered, and verified current allergies; name and call back number provided for further questions should they arise Connor Bond RN Navigator Cardiac Imaging [facility] [phone] office [phone] cell  Arrival 900 WC entrance Denies Iv issues Daily meds, holding metformin 48 hr post Getting labs tomorrow 2/15 Aware contrast/nitro

## 2022-10-17 ENCOUNTER — Ambulatory Visit: Payer: Medicare PPO | Attending: Internal Medicine

## 2022-10-17 DIAGNOSIS — R001 Bradycardia, unspecified: Secondary | ICD-10-CM

## 2022-10-17 DIAGNOSIS — Z01812 Encounter for preprocedural laboratory examination: Secondary | ICD-10-CM

## 2022-10-17 DIAGNOSIS — R072 Precordial pain: Secondary | ICD-10-CM

## 2022-10-17 LAB — BASIC METABOLIC PANEL
BUN/Creatinine Ratio: 19 (ref 10–24)
BUN: 22 mg/dL (ref 8–27)
CO2: 26 mmol/L (ref 20–29)
Calcium: 9.4 mg/dL (ref 8.6–10.2)
Chloride: 103 mmol/L (ref 96–106)
Creatinine, Ser: 1.17 mg/dL (ref 0.76–1.27)
Glucose: 102 mg/dL — ABNORMAL HIGH (ref 70–99)
Potassium: 4.6 mmol/L (ref 3.5–5.2)
Sodium: 141 mmol/L (ref 134–144)
eGFR: 66 mL/min/{1.73_m2} (ref >59)

## 2022-10-18 ENCOUNTER — Ambulatory Visit (HOSPITAL_COMMUNITY)
Admission: RE | Admit: 2022-10-18 | Discharge: 2022-10-18 | Disposition: A | Discharge status: Home / Self Care | Payer: Medicare PPO | Source: Ambulatory Visit | Attending: Internal Medicine | Admitting: Internal Medicine

## 2022-10-18 ENCOUNTER — Other Ambulatory Visit: Payer: Self-pay | Admitting: Cardiovascular Disease

## 2022-10-18 DIAGNOSIS — R0609 Other forms of dyspnea: Secondary | ICD-10-CM | POA: Insufficient documentation

## 2022-10-18 DIAGNOSIS — I2510 Atherosclerotic heart disease of native coronary artery without angina pectoris: Secondary | ICD-10-CM | POA: Diagnosis not present

## 2022-10-18 DIAGNOSIS — I4891 Unspecified atrial fibrillation: Secondary | ICD-10-CM | POA: Insufficient documentation

## 2022-10-18 DIAGNOSIS — R931 Abnormal findings on diagnostic imaging of heart and coronary circulation: Secondary | ICD-10-CM | POA: Insufficient documentation

## 2022-10-18 DIAGNOSIS — R072 Precordial pain: Secondary | ICD-10-CM | POA: Insufficient documentation

## 2022-10-18 MED ORDER — NITROGLYCERIN 0.4 MG SL SUBL
SUBLINGUAL_TABLET | SUBLINGUAL | Status: AC
  Filled 2022-10-18: qty 2

## 2022-10-18 MED ORDER — IOHEXOL 350 MG/ML SOLN
75.0000 mL | Freq: Once | INTRAVENOUS | Status: AC | PRN
  Administered 2022-10-18: 75 mL via INTRAVENOUS

## 2022-10-18 MED ORDER — NITROGLYCERIN 0.4 MG SL SUBL
0.8000 mg | SUBLINGUAL_TABLET | Freq: Once | SUBLINGUAL | Status: AC
  Administered 2022-10-18: 0.8 mg via SUBLINGUAL

## 2022-10-19 ENCOUNTER — Ambulatory Visit (HOSPITAL_BASED_OUTPATIENT_CLINIC_OR_DEPARTMENT_OTHER)
Admission: RE | Admit: 2022-10-19 | Discharge: 2022-10-19 | Disposition: A | Discharge status: Home / Self Care | Payer: Medicare PPO | Source: Ambulatory Visit | Attending: Cardiovascular Disease | Admitting: Cardiovascular Disease

## 2022-10-19 DIAGNOSIS — R0609 Other forms of dyspnea: Secondary | ICD-10-CM | POA: Diagnosis not present

## 2022-10-19 DIAGNOSIS — R931 Abnormal findings on diagnostic imaging of heart and coronary circulation: Secondary | ICD-10-CM

## 2022-10-19 DIAGNOSIS — I2510 Atherosclerotic heart disease of native coronary artery without angina pectoris: Secondary | ICD-10-CM | POA: Diagnosis not present

## 2022-11-07 ENCOUNTER — Ambulatory Visit: Payer: Medicare PPO | Attending: Internal Medicine | Admitting: Internal Medicine

## 2022-11-07 ENCOUNTER — Encounter: Payer: Self-pay | Admitting: Internal Medicine

## 2022-11-07 VITALS — BP 118/70 | HR 47 | Ht 74.0 in | Wt 280.0 lb

## 2022-11-07 DIAGNOSIS — R001 Bradycardia, unspecified: Secondary | ICD-10-CM | POA: Diagnosis not present

## 2022-11-07 MED ORDER — ASPIRIN 81 MG PO TBEC: 81.0000 mg | DELAYED_RELEASE_TABLET | Freq: Every day | ORAL | 3 refills | Status: DC

## 2022-11-07 NOTE — Patient Instructions (Signed)
Medication Instructions:  Your physician has recommended you make the following change in your medication:  1) START taking Aspirin 81 mg daily *If you need a refill on your cardiac medications before your next appointment, please call your pharmacy*  Follow-Up: At [facility], you and your health needs are our priority.  As part of our continuing mission to provide you with exceptional heart care, we have created designated Provider Care Teams.  These Care Teams include your primary Cardiologist (physician) and Advanced Practice Providers (APPs -  Physician Assistants and Nurse Practitioners) who all work together to provide you with the care you need, when you need it.  Your next appointment:   3 month(s)  Provider:   You may see Virl Axe, MD or one of the following Advanced Practice Providers on your designated Care Team:   Tommye Standard, [place] "Jonni Sanger" [place], [place], NP

## 2022-11-07 NOTE — Progress Notes (Signed)
Patient Care Team: Johna Roles, [place] as PCP - General (Internal Medicine) Janina Mayo, MD as PCP - Cardiology (Cardiology) Deboraha Sprang, MD as PCP - Electrophysiology (Cardiology)   HPI  Connor King is a 72 y.o. male seen in follow-up for bradycardia with second-degree AV block type I with a longstanding history of first-degree AV block and resting bradycardia.  Also worsening fatigue concurrent with the introduction of Cymbalta for neuropathy>> better but still persistent following discontinuation but neuropathy is worse.  Still overall he is more fatigued than 6-12 months ago, confirmed by his wife   Event recorder was placed after the initial bradycardia and was associated with reasonable heart rate excursion and indeed average heart rates during exercise times of about 100.     Here to review repeat monitor     No known coronary disease.  CT scans were reviewed without comments about coronary artery calcification history of DVT and PE the former about 2 decades ago the latter about 6 years ago since which time he has been on full dose rivaroxaban I    Severe sleep apnea-treated     DATE TEST EF    12/23 Echo   60-65 %     2/24 CTA   CaScore 142 FFR neg for obstruction    Date Cr K Hgb  11/23 1.2 4.2 13.6             Records and Results Reviewed   Past Medical History:  Diagnosis Date   Anticoagulant long-term use    xarelto---  managed by pcp (dr Lavone Orn)  takes for hx dvt/ pe's   Diabetes mellitus type 2, diet-controlled ([place])    followed by pcp--- per pt does not check cbg at home   First degree heart block    History of colon cancer 06-23-2019  per pt no recurrance   Stage I  --  02-10-2002  s/p  sigmoid colectomy with primary anastomosis/ small bowel resection (diverticulitis)/ appendectomy   History of diverticulitis of colon    History of DVT of lower extremity    11/ 2002  right lower leg   History of kidney stones     History of pulmonary embolus (PE)    09/ 2017  bilateral PE's--- resolved--- long term xarelto   Hyperlipidemia    OSA on CPAP    Ureteral calculi    left    Past Surgical History:  Procedure Laterality Date   CHOLECYSTECTOMY  07/25/2012   Procedure: LAPAROSCOPIC CHOLECYSTECTOMY WITH INTRAOPERATIVE CHOLANGIOGRAM;  Surgeon: Edward Jolly, MD;  Location: [facility];  Service: General;  Laterality: N/A;   COLONOSCOPY WITH PROPOFOL N/A 04/01/2017   Procedure: COLONOSCOPY WITH PROPOFOL;  Surgeon: Ronald Lobo, MD;  Location: [facility];  Service: Endoscopy;  Laterality: N/A;   CYSTOSCOPY/RETROGRADE/URETEROSCOPY/STONE EXTRACTION WITH BASKET Left 06/25/2019   Procedure: CYSTOSCOPY/RETROGRADE/URETEROSCOPY/STONE EXTRACTION WITH BASKET/ POSSIBLE STENT PLACEMENT;  Surgeon: Kathie Rhodes, MD;  Location: [place];  Service: Urology;  Laterality: Left;   EXTRACORPOREAL SHOCK WAVE LITHOTRIPSY Left 04/26/2019   Procedure: EXTRACORPOREAL SHOCK WAVE LITHOTRIPSY (ESWL);  Surgeon: Ceasar Mons, MD;  Location: [facility];  Service: Urology;  Laterality: Left;   LAPAROSCOPIC ASSISTED VENTRAL HERNIA REPAIR  02-21-2005    dr Dalbert Batman  '@[facility]'$    w/  lysis adhesions   LITHOTRIPSY     over30 years ago   PARTIAL COLECTOMY  02-10-2002   dr Dalbert Batman '@[facility]'$    Sigmoid colectomy  w/ primary anastomosis/  small bowel resection for meckel's diverticulitis/  appendectomy   TENDON REPAIR Right 2011   open right knee   TOTAL KNEE ARTHROPLASTY Left 08-09-2009  dr graves  '@[facility]'$     Current Meds  Medication Sig   acetaminophen (TYLENOL) 500 MG tablet Take 1,000 mg by mouth every 6 (six) hours as needed for mild pain or headache.   amoxicillin (AMOXIL) 500 MG tablet 4 tabs prior to appointment   atorvastatin (LIPITOR) 10 MG tablet Take 10 mg by mouth daily.   cholecalciferol (VITAMIN D) 1000 units tablet Take 1,000 Units by mouth daily.   Cyanocobalamin (VITAMIN B 12 PO) Take 1,000 mcg by mouth daily.    DULoxetine (CYMBALTA) 60 MG capsule Take 60 mg by mouth daily.   gabapentin (NEURONTIN) 300 MG capsule Take 600 mg by mouth daily.   metFORMIN (GLUCOPHAGE-XR) 500 MG 24 hr tablet Take 500 mg by mouth daily.   MOUNJARO 7.5 MG/0.5ML Pen Inject 7.5 mg into the skin once a week.   rivaroxaban (XARELTO) 20 MG TABS tablet Take 20 mg by mouth daily.    No Known Allergies    Review of Systems negative except from HPI and PMH  Physical Exam BP 118/70   Pulse (!) 47   Ht '6\' 2"'$  (1.88 m)   Wt (!) 380 lb 9.6 oz (172.6 kg)   SpO2 92%   BMI 48.87 kg/m  Well developed and well nourished in no acute distress HENT normal E scleral and icterus clear Neck Supple JVP flat; carotids brisk and full Clear to auscultation IRRRegular rate and rhythm, no murmurs gallops or rub Soft with active bowel sounds No clubbing cyanosis Trace Edema Alert and oriented, grossly normal motor and sensory function Skin Warm and Dry  ECG sinus at 80 Second-degree AV block type I as suggested by grouped beating although I cannot measure out the pattern exactly  CrCl cannot be calculated (Patient's most recent lab result is older than the maximum 21 days allowed.).   Assessment and  Plan AV nodal conduction system disease progressive   Fatigue and dyspnea   Obesity on Mounjaro   History of pulmonary embolism on rivaroxaban  Coronary artery calcification   Reviewed extensively the event recorder with daytime heart rates sometimes averaging below 50 which seems to correlate with complete heart block again with a narrow QRS, heart rate excursion averages are all less than 100 and occasionally he gets as high as 105 or so.  I suspect that he has symptomatic bradycardia related to his heart block and as it is progressive symptoms will also worsen.  I do not think he is at risk for abrupt heart block without escape given that his AV nodal in origin.  We discussed pacemaker implantation, reviewed the risks and  benefits.  At this point he would like to continue to consider that as an option  With his coronary artery calcification will begin ASA 81 and continue statin     Current medicines are reviewed at length with the patient today .  The patient does not  have concerns regarding medicines.

## 2022-11-08 ENCOUNTER — Encounter: Payer: Self-pay | Admitting: Internal Medicine

## 2022-12-19 ENCOUNTER — Encounter: Payer: Self-pay | Admitting: Internal Medicine

## 2022-12-19 DIAGNOSIS — I951 Orthostatic hypotension: Secondary | ICD-10-CM | POA: Diagnosis not present

## 2022-12-19 DIAGNOSIS — E1142 Type 2 diabetes mellitus with diabetic polyneuropathy: Secondary | ICD-10-CM | POA: Diagnosis not present

## 2022-12-19 DIAGNOSIS — Z86711 Personal history of pulmonary embolism: Secondary | ICD-10-CM | POA: Diagnosis not present

## 2022-12-19 DIAGNOSIS — E1169 Type 2 diabetes mellitus with other specified complication: Secondary | ICD-10-CM | POA: Diagnosis not present

## 2022-12-19 DIAGNOSIS — R202 Paresthesia of skin: Secondary | ICD-10-CM | POA: Diagnosis not present

## 2022-12-19 DIAGNOSIS — I442 Atrioventricular block, complete: Secondary | ICD-10-CM | POA: Diagnosis not present

## 2022-12-19 DIAGNOSIS — E1143 Type 2 diabetes mellitus with diabetic autonomic (poly)neuropathy: Secondary | ICD-10-CM | POA: Diagnosis not present

## 2022-12-19 DIAGNOSIS — R42 Dizziness and giddiness: Secondary | ICD-10-CM | POA: Diagnosis not present

## 2022-12-19 DIAGNOSIS — E781 Pure hyperglyceridemia: Secondary | ICD-10-CM | POA: Diagnosis not present

## 2022-12-27 ENCOUNTER — Ambulatory Visit: Payer: Medicare PPO | Attending: Physician Assistant | Admitting: Physician Assistant

## 2022-12-27 ENCOUNTER — Encounter: Payer: Self-pay | Admitting: Physician Assistant

## 2022-12-27 VITALS — BP 122/76 | HR 51 | Ht 74.0 in | Wt 275.0 lb

## 2022-12-27 DIAGNOSIS — I4430 Unspecified atrioventricular block: Secondary | ICD-10-CM

## 2022-12-27 DIAGNOSIS — D6869 Other thrombophilia: Secondary | ICD-10-CM | POA: Diagnosis not present

## 2022-12-27 DIAGNOSIS — R001 Bradycardia, unspecified: Secondary | ICD-10-CM | POA: Diagnosis not present

## 2022-12-27 NOTE — Patient Instructions (Addendum)
Medication Instructions:   Your physician recommends that you continue on your current medications as directed. Please refer to the Current Medication list given to you today.   *If you need a refill on your cardiac medications before your next appointment, please call your pharmacy*   Lab Work: BMET AND CBC TODAY    If you have labs (blood work) drawn today and your tests are completely normal, you will receive your results only by: MyChart Message (if you have MyChart) OR A paper copy in the mail If you have any lab test that is abnormal or we need to change your treatment, we will call you to review the results.   Testing/Procedures: SEE LETTER BELOW FOR DEVICE INSTRUCTIONS FOR DEVICE IMPLANT     Follow-Up: At [facility], you and your health needs are our priority.  As part of our continuing mission to provide you with exceptional heart care, we have created designated Provider Care Teams.  These Care Teams include your primary Cardiologist (physician) and Advanced Practice Providers (APPs -  Physician Assistants and Nurse Practitioners) who all work together to provide you with the care you need, when you need it.  We recommend signing up for the patient portal called "MyChart".  Sign up information is provided on this After Visit Summary.  MyChart is used to connect with patients for Virtual Visits (Telemedicine).  Patients are able to view lab/test results, encounter notes, upcoming appointments, etc.  Non-urgent messages can be sent to your provider as well.   To learn more about what you can do with MyChart, go to [email or web address].    Your next appointment:  AFTER  02-24-23  10-14 DAYS DEVICE CLINIC  3 month(s)  Provider:   Sherryl Manges, MD    Other Instructions     Implantable Device Instructions    Ly CREIGHTON LONGLEY  12/27/2022  You are scheduled for a Permanent transvenous pacemaker (PPM) on Monday, June 24  with Dr. Sherryl Manges.  1. Pre procedure  Lab testing: You will come to the [facility] office on Friday, April 26 any time between 8:00 am and 4:30 pm.  You do NOT need to be fasting.   2. Please arrive at the [facility] (Main Entrance A) at [facility]: [address] [place], [place] [address] at 7:30 AM (This time is 2 hour(s) before your procedure to ensure your preparation). Free valet parking service is available. You will check in at ADMITTING. The support person will be asked to wait in the waiting room.  It is OK to have someone drop you off and come back when you are ready to be discharged.          Special note: Every effort is made to have your procedure done on time. Please understand that emergencies sometimes delay  scheduled procedures.  3.  No eating or drinking after midnight prior to procedure.     4.  Medication instructions:  On the morning of your procedure hold your Xarelto (Rivaroxaban) for 2 day(s) prior to your procedure. Your last dose will be Friday, June 21, PM dose.      !!IF ANY NEW MEDICATIONS ARE STARTED AFTER TODAY, PLEASE NOTIFY YOUR PROVIDER AS SOON AS POSSIBLE!!  FYI: Medications such as Semaglutide (Ozempic, [place]), Tirzepatide (Mounjaro, Zepbound), Dulaglutide (Trulicity), etc ("GLP1 agonists") must be held around the time of a procedure. Talk to your provider if you take one of these.  5.  The night before your procedure and  the morning of your procedure scrub your neck/chest with CHG surgical scrub.  See instruction letter.  6. Plan to go home the same day, you will only stay overnight if medically necessary. 7. You MUST have a responsible adult to drive you home. 8. An adult MUST be with you the first 24 hours after you arrive home. 9. Bring a current list of your medications, and the last time and date medication taken. 10. Bring ID and current insurance cards. 11. Please wear clothes that are easy to get on and off and wear slip-on shoes.    You will follow up with the [facility]  Device clinic 10-14 days after your procedure.  You will follow up with Dr. Sherryl Manges 91 days after your procedure.  These appointments will be made for you.   * If you have ANY questions after you get home, please call the office at [phone] or send a MyChart message.  FYI: For your safety, and to allow [place] to monitor your vital signs accurately during the surgery/procedure we request that if you have artificial nails, gel coating, SNS etc. Please have those removed prior to your surgery/procedure. Not having the nail coverings /polish removed may result in cancellation or delay of your surgery/procedure.    [place] - Preparing For Surgery    Before surgery, you can play an important role. Because skin is not sterile, your skin needs to be as free of germs as possible. You can reduce the number of germs on your skin by washing with CHG (chlorahexidine gluconate) Soap before surgery.  CHG is an antiseptic cleaner which kills germs and bonds with the skin to continue killing germs even after washing.  Please do not use if you have an allergy to CHG or antibacterial soaps.  If your skin becomes reddened/irritated stop using the CHG.   Do not shave (including legs and underarms) for at least 48 hours prior to first CHG shower.  It is OK to shave your face.  Please follow these instructions carefully:  1.  Shower the night before surgery and the morning of surgery with CHG.  2.  If you choose to wash your hair, wash your hair first as usual with your normal shampoo.  3.  After you shampoo, rinse your hair and body thoroughly to remove the shampoo.  4.  Use CHG as you would any other liquid soap.  You can apply CHG directly to the skin and wash gently with a clean washcloth. 5.  Apply the CHG Soap to your body ONLY FROM THE NECK DOWN.  Do not use on open wounds or open sores.  Avoid contact with your eyes, ears, mouth and genitals (private parts).  Wash genitals (private parts) with your  normal soap.  6.  Wash thoroughly, paying special attention to the area where your surgery will be performed.  7.  Thoroughly rinse your body with warm water from the neck down.   8.  DO NOT shower/wash with your normal soap after using and rinsing off the CHG soap.  9.  Pat yourself dry with a clean towel.           10.  Wear clean pajamas.           11.  Place clean sheets on your bed the night of your first shower and do not sleep with pets.  Day of Surgery: Do not apply any deodorants/lotions.  Please wear clean clothes to the hospital/surgery center.

## 2022-12-27 NOTE — Progress Notes (Signed)
Cardiology Office Note Date:  12/27/2022  Patient ID:  Connor King 1950-12-31, MRN [id number] PCP:  Delma Officer, PA  Cardiologist:  Dr. Tereso Newcomer Electrophysiologist: Dr. Graciela Husbands    Chief Complaint:  3 mo  History of Present Illness: Connor King is a 72 y.o. male with history of DM, Stage I colonc CA s/p sigmoid colectomy w/ primary anastomosis/small bowel resection, basal cell carcinoma, DVT/PE (on OAC), OSA w/CPAP  Referred to Dr. Graciela Husbands 10/02/22, for bradycardia.  Noted EKGs with 1st degree and Mobitz one AVblock Reports of fatigue timeline associated with start of cymbalta for neuropathy. Review of his monitor he reports reasonable heart rate excursion and indeed average heart rates during exercise times of about 100. The report is woefully incomplete. Not convinced AV block was sole contributor to fatigue, advised to hold the cymbalta a few weeks to see if he feels better Re-monitor with AT monitoring. Coronary CTa to evaluate for CAD  11/07/22 f/u with Dr. Graciela Husbands, fatigue without clear improvement off cymbalta, neuropathy clearly worse. event recorder with daytime heart rates sometimes averaging below 50 which seems to correlate with complete heart block again with a narrow QRS, heart rate excursion averages are all less than 100 and occasionally he gets as high as 105 or so.  I suspect that he has symptomatic bradycardia related to his heart block and as it is progressive symptoms will also worsen.  I do not think he is at risk for abrupt heart block without escape given that his AV nodal in origin.  Discussed PPM The patient was going to give it some thought NOD on CT and started on ASA  Pt message recently with decision to pursue PPM and given an appt to revisit this with APP.  TODAY He is accompanied by his wife Symptoms continue, generally fatigued, tired most/all of the time, some lightheadedness, especially when standing up after bending over. No  syncope. No CP, palpitations, falls In d/w his PMD recently, who felt he should proceed, he has decided to go ahead with PPM implant   Past Medical History:  Diagnosis Date   Anticoagulant long-term use    xarelto---  managed by pcp (dr Kirby Funk)  takes for hx dvt/ pe's   Diabetes mellitus type 2, diet-controlled (HCC)    followed by pcp--- per pt does not check cbg at home   First degree heart block    History of colon cancer 06-23-2019  per pt no recurrance   Stage I  --  02-10-2002  s/p  sigmoid colectomy with primary anastomosis/ small bowel resection (diverticulitis)/ appendectomy   History of diverticulitis of colon    History of DVT of lower extremity    11/ 2002  right lower leg   History of kidney stones    History of pulmonary embolus (PE)    09/ 2017  bilateral PE's--- resolved--- long term xarelto   Hyperlipidemia    OSA on CPAP    Ureteral calculi    left    Past Surgical History:  Procedure Laterality Date   CHOLECYSTECTOMY  07/25/2012   Procedure: LAPAROSCOPIC CHOLECYSTECTOMY WITH INTRAOPERATIVE CHOLANGIOGRAM;  Surgeon: Mariella Saa, MD;  Location: [facility];  Service: General;  Laterality: N/A;   COLONOSCOPY WITH PROPOFOL N/A 04/01/2017   Procedure: COLONOSCOPY WITH PROPOFOL;  Surgeon: Bernette Redbird, MD;  Location: [facility];  Service: Endoscopy;  Laterality: N/A;   CYSTOSCOPY/RETROGRADE/URETEROSCOPY/STONE EXTRACTION WITH BASKET Left 06/25/2019   Procedure: CYSTOSCOPY/RETROGRADE/URETEROSCOPY/STONE EXTRACTION WITH  BASKET/ POSSIBLE STENT PLACEMENT;  Surgeon: Ihor Gully, MD;  Location: [facility];  Service: Urology;  Laterality: Left;   EXTRACORPOREAL SHOCK WAVE LITHOTRIPSY Left 04/26/2019   Procedure: EXTRACORPOREAL SHOCK WAVE LITHOTRIPSY (ESWL);  Surgeon: Rene Paci, MD;  Location: [facility];  Service: Urology;  Laterality: Left;   LAPAROSCOPIC ASSISTED VENTRAL HERNIA REPAIR  02-21-2005    dr Derrell Lolling  @[facility]    w/  lysis  adhesions   LITHOTRIPSY     over30 years ago   PARTIAL COLECTOMY  02-10-2002   dr Derrell Lolling @[facility]    Sigmoid colectomy w/ primary anastomosis/  small bowel resection for meckel's diverticulitis/  appendectomy   TENDON REPAIR Right 2011   open right knee   TOTAL KNEE ARTHROPLASTY Left 08-09-2009  dr graves  @[facility]     Current Outpatient Medications  Medication Sig Dispense Refill   acetaminophen (TYLENOL) 500 MG tablet Take 1,000 mg by mouth every 6 (six) hours as needed for mild pain or headache.     amoxicillin (AMOXIL) 500 MG tablet 4 tabs prior to appointment     aspirin EC 81 MG tablet Take 1 tablet (81 mg total) by mouth daily. Swallow whole. 90 tablet 3   atorvastatin (LIPITOR) 10 MG tablet Take 10 mg by mouth daily.     cholecalciferol (VITAMIN D) 1000 units tablet Take 1,000 Units by mouth daily.     Cyanocobalamin (VITAMIN B 12 PO) Take 1,000 mcg by mouth daily.     DULoxetine (CYMBALTA) 60 MG capsule Take 60 mg by mouth daily.     gabapentin (NEURONTIN) 300 MG capsule Take 600 mg by mouth daily.     metFORMIN (GLUCOPHAGE-XR) 500 MG 24 hr tablet Take 500 mg by mouth daily.     MOUNJARO 7.5 MG/0.5ML Pen Inject 7.5 mg into the skin once a week.     rivaroxaban (XARELTO) 20 MG TABS tablet Take 20 mg by mouth daily.     No current facility-administered medications for this visit.    Allergies:   Patient has no known allergies.   Social History:  The patient  reports that he has never smoked. He has never used smokeless tobacco. He reports that he does not drink alcohol and does not use drugs.   Family History:  The patient's family history is not on file.  ROS:  Please see the history of present illness.    All other systems are reviewed and otherwise negative.   PHYSICAL EXAM:  VS:  There were no vitals taken for this visit. BMI: There is no height or weight on file to calculate BMI. Well nourished, well developed, in no acute distress HEENT: normocephalic, atraumatic Neck: no  JVD, carotid bruits or masses Cardiac:  RRR; no significant murmurs, no rubs, or gallops Lungs:   CTA b/l, no wheezing, rhonchi or rales Abd: soft, nontender MS: no deformity or atrophy Ext: no edema Skin: warm and dry, no rash Neuro:  No gross deficits appreciated Psych: euthymic mood, full affect    EKG:  Done today and reviewed by myself shows  SB 51bpm, Mobitz I heart block, QRS 84ms   10/18/22: Coronary CT IMPRESSION: 1. Coronary calcium score of 142. This was 45th percentile for age-, race-, and sex-matched controls. 2. Normal coronary origin with right dominance. 3.  There mild (25-49%) plaque in the RCA.  CAD-RADS 2. 4. The RCA is not well-visualized due to motion. Will send study for FFRct. 1. Left Main: FFRct 0.99 2. LAD: FFRct 0.98 proximal,  0.95 mid, 0.87 distal 3. LCX: FFRct 0.99 proximal, 0.93 mid, 0.89 distal.  OM1 FFRct 0.96 4. RCA: FFRct 1.0 proximal, 0.99 mid, 0.98 distal IMPRESSION: 1.  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   08/06/22: TTE 1. Left ventricular ejection fraction, by estimation, is 60 to 65%. The  left ventricle has normal function. The left ventricle has no regional  wall motion abnormalities. Left ventricular diastolic function could not  be evaluated.   2. Right ventricular systolic function is normal. The right ventricular  size is mildly enlarged. There is normal pulmonary artery systolic  pressure. The estimated right ventricular systolic pressure is 29.0 mmHg.   3. Left atrial size was severely dilated.   4. Right atrial size was severely dilated.   5. The mitral valve is normal in structure. Trivial mitral valve  regurgitation. No evidence of mitral stenosis.   6. The aortic valve is normal in structure. Aortic valve regurgitation is  trivial. No aortic stenosis is present.   7. Aortic dilatation noted. There is mild dilatation of the aortic root,  measuring 42 mm. There is mild dilatation of the ascending aorta,   measuring 41 mm.   8. The inferior vena cava is normal in size with greater than 50%  respiratory variability, suggesting right atrial pressure of 3 mmHg.   Recent Labs: 10/17/2022: BUN 22; Creatinine, Ser 1.17; Potassium 4.6; Sodium 141  No results found for requested labs within last 365 days.   CrCl cannot be calculated (Patient's most recent lab result is older than the maximum 21 days allowed.).   Wt Readings from Last 3 Encounters:  11/07/22 280 lb (127 kg)  10/02/22 272 lb (123.4 kg)  07/08/22 281 lb 3.2 oz (127.6 kg)     Other studies reviewed: Additional studies/records reviewed today include: summarized above  ASSESSMENT AND PLAN:  Conduction system disease Variable block, including CHB Symptomatic bradycardia Previously discussed PPM candidate/indication  We revisited PPM implant procedure, potential risks/benefits, he would like to proceed Reviewed procedure day and post procedure restrictions Advised no Xarelto 2 days ahead  Scheduling next available on Dr. Odessa Fleming schedule that works best for his best day/time is in June We reviewed symptoms to seek attention for   Hx of DVT/PE On Xarelto via PMD Last event the PE was 2017   Secondary hypercoagulable state   Disposition: F/u with usual post procedure follow up  Current medicines are reviewed at length with the patient today.  The patient did not have any concerns regarding medicines.  Norma Fredrickson, PA-C 12/27/2022 11:48 AM     [facility] [address] [place] [address] [phone] (office)  [phone] (fax)

## 2022-12-28 LAB — BASIC METABOLIC PANEL
BUN/Creatinine Ratio: 23 (ref 10–24)
BUN: 24 mg/dL (ref 8–27)
CO2: 25 mmol/L (ref 20–29)
Calcium: 10.4 mg/dL — ABNORMAL HIGH (ref 8.6–10.2)
Chloride: 102 mmol/L (ref 96–106)
Creatinine, Ser: 1.06 mg/dL (ref 0.76–1.27)
Glucose: 102 mg/dL — ABNORMAL HIGH (ref 70–99)
Potassium: 4.6 mmol/L (ref 3.5–5.2)
Sodium: 143 mmol/L (ref 134–144)
eGFR: 75 mL/min/{1.73_m2} (ref >59)

## 2022-12-28 LAB — CBC
Hematocrit: 44.8 % (ref 37.5–51.0)
Hemoglobin: 14.9 g/dL (ref 13.0–17.7)
MCH: 31.2 pg (ref 26.6–33.0)
MCHC: 33.3 g/dL (ref 31.5–35.7)
MCV: 94 fL (ref 79–97)
Platelets: 193 10*3/uL (ref 150–450)
RBC: 4.77 x10E6/uL (ref 4.14–5.80)
RDW: 13.1 % (ref 11.6–15.4)
WBC: 6.7 10*3/uL (ref 3.4–10.8)

## 2023-01-14 DIAGNOSIS — G4733 Obstructive sleep apnea (adult) (pediatric): Secondary | ICD-10-CM | POA: Diagnosis not present

## 2023-02-07 ENCOUNTER — Ambulatory Visit: Payer: Medicare PPO | Admitting: Physician Assistant

## 2023-02-10 DIAGNOSIS — C4442 Squamous cell carcinoma of skin of scalp and neck: Secondary | ICD-10-CM | POA: Diagnosis not present

## 2023-02-10 DIAGNOSIS — D485 Neoplasm of uncertain behavior of skin: Secondary | ICD-10-CM | POA: Diagnosis not present

## 2023-02-10 DIAGNOSIS — D2261 Melanocytic nevi of right upper limb, including shoulder: Secondary | ICD-10-CM | POA: Diagnosis not present

## 2023-02-10 DIAGNOSIS — L821 Other seborrheic keratosis: Secondary | ICD-10-CM | POA: Diagnosis not present

## 2023-02-10 DIAGNOSIS — D044 Carcinoma in situ of skin of scalp and neck: Secondary | ICD-10-CM | POA: Diagnosis not present

## 2023-02-10 DIAGNOSIS — Z85828 Personal history of other malignant neoplasm of skin: Secondary | ICD-10-CM | POA: Diagnosis not present

## 2023-02-10 DIAGNOSIS — D225 Melanocytic nevi of trunk: Secondary | ICD-10-CM | POA: Diagnosis not present

## 2023-02-10 DIAGNOSIS — L570 Actinic keratosis: Secondary | ICD-10-CM | POA: Diagnosis not present

## 2023-02-21 NOTE — Pre-Procedure Instructions (Signed)
Attempted to call patient regarding procedure instructions.  Left voicemail on the following items: Arrival time 0730 Nothing to eat or drink after midnight No meds AM of procedure Responsible person to drive you home and stay with you for 24 hrs Wash with special soap night before and morning of procedure If on anti-coagulant drug instructions Xarelto- last dose today.

## 2023-02-24 ENCOUNTER — Ambulatory Visit (HOSPITAL_COMMUNITY)
Admission: RE | Admit: 2023-02-24 | Discharge: 2023-02-24 | Disposition: A | Discharge status: Home / Self Care | Payer: Medicare PPO | Attending: Internal Medicine | Admitting: Internal Medicine

## 2023-02-24 ENCOUNTER — Other Ambulatory Visit: Payer: Self-pay

## 2023-02-24 ENCOUNTER — Encounter (HOSPITAL_COMMUNITY)
Admission: RE | Disposition: A | Discharge status: Home / Self Care | Payer: Self-pay | Source: Home / Self Care | Attending: Internal Medicine

## 2023-02-24 ENCOUNTER — Ambulatory Visit (HOSPITAL_COMMUNITY): Payer: Medicare PPO

## 2023-02-24 DIAGNOSIS — R42 Dizziness and giddiness: Secondary | ICD-10-CM | POA: Insufficient documentation

## 2023-02-24 DIAGNOSIS — I441 Atrioventricular block, second degree: Secondary | ICD-10-CM | POA: Diagnosis not present

## 2023-02-24 DIAGNOSIS — Z7901 Long term (current) use of anticoagulants: Secondary | ICD-10-CM | POA: Diagnosis not present

## 2023-02-24 DIAGNOSIS — I2510 Atherosclerotic heart disease of native coronary artery without angina pectoris: Secondary | ICD-10-CM | POA: Insufficient documentation

## 2023-02-24 DIAGNOSIS — E669 Obesity, unspecified: Secondary | ICD-10-CM | POA: Insufficient documentation

## 2023-02-24 DIAGNOSIS — R5383 Other fatigue: Secondary | ICD-10-CM | POA: Insufficient documentation

## 2023-02-24 DIAGNOSIS — E119 Type 2 diabetes mellitus without complications: Secondary | ICD-10-CM | POA: Diagnosis not present

## 2023-02-24 DIAGNOSIS — R0600 Dyspnea, unspecified: Secondary | ICD-10-CM | POA: Diagnosis not present

## 2023-02-24 DIAGNOSIS — I495 Sick sinus syndrome: Secondary | ICD-10-CM | POA: Diagnosis not present

## 2023-02-24 DIAGNOSIS — Z4682 Encounter for fitting and adjustment of non-vascular catheter: Secondary | ICD-10-CM | POA: Diagnosis not present

## 2023-02-24 DIAGNOSIS — Z86718 Personal history of other venous thrombosis and embolism: Secondary | ICD-10-CM | POA: Diagnosis not present

## 2023-02-24 DIAGNOSIS — Z7984 Long term (current) use of oral hypoglycemic drugs: Secondary | ICD-10-CM | POA: Diagnosis not present

## 2023-02-24 DIAGNOSIS — Z6833 Body mass index (BMI) 33.0-33.9, adult: Secondary | ICD-10-CM | POA: Diagnosis not present

## 2023-02-24 HISTORY — PX: PACEMAKER IMPLANT: EP1218

## 2023-02-24 LAB — POCT I-STAT, CHEM 8
BUN: 25 mg/dL — ABNORMAL HIGH (ref 8–23)
Calcium, Ion: 1.13 mmol/L — ABNORMAL LOW (ref 1.15–1.40)
Chloride: 105 mmol/L (ref 98–111)
Creatinine, Ser: 1.1 mg/dL (ref 0.61–1.24)
Glucose, Bld: 91 mg/dL (ref 70–99)
HCT: 44 % (ref 39.0–52.0)
Hemoglobin: 15 g/dL (ref 13.0–17.0)
Potassium: 3.9 mmol/L (ref 3.5–5.1)
Sodium: 142 mmol/L (ref 135–145)
TCO2: 26 mmol/L (ref 22–32)

## 2023-02-24 LAB — BASIC METABOLIC PANEL
Anion gap: 7 (ref 5–15)
BUN: 23 mg/dL (ref 8–23)
CO2: 27 mmol/L (ref 22–32)
Calcium: 9.2 mg/dL (ref 8.9–10.3)
Chloride: 104 mmol/L (ref 98–111)
Creatinine, Ser: 1.12 mg/dL (ref 0.61–1.24)
GFR, Estimated: >60 mL/min (ref >60)
Glucose, Bld: 107 mg/dL — ABNORMAL HIGH (ref 70–99)
Potassium: 5.6 mmol/L — ABNORMAL HIGH (ref 3.5–5.1)
Sodium: 138 mmol/L (ref 135–145)

## 2023-02-24 LAB — CBC
HCT: 42.1 % (ref 39.0–52.0)
Hemoglobin: 14.1 g/dL (ref 13.0–17.0)
MCH: 31.4 pg (ref 26.0–34.0)
MCHC: 33.5 g/dL (ref 30.0–36.0)
MCV: 93.8 fL (ref 80.0–100.0)
Platelets: 184 10*3/uL (ref 150–400)
RBC: 4.49 MIL/uL (ref 4.22–5.81)
RDW: 13.4 % (ref 11.5–15.5)
WBC: 6.1 10*3/uL (ref 4.0–10.5)
nRBC: 0 % (ref 0.0–0.2)

## 2023-02-24 LAB — GLUCOSE, CAPILLARY
Glucose-Capillary: 102 mg/dL — ABNORMAL HIGH (ref 70–99)
Glucose-Capillary: 102 mg/dL — ABNORMAL HIGH (ref 70–99)

## 2023-02-24 SURGERY — PACEMAKER IMPLANT

## 2023-02-24 MED ORDER — MIDAZOLAM HCL 5 MG/5ML IJ SOLN
INTRAMUSCULAR | Status: DC | PRN
  Administered 2023-02-24: 1 mg via INTRAVENOUS
  Administered 2023-02-24 (×2): 2 mg via INTRAVENOUS
  Administered 2023-02-24 (×2): 1 mg via INTRAVENOUS

## 2023-02-24 MED ORDER — POVIDONE-IODINE 10 % EX SWAB
2.0000 | Freq: Once | CUTANEOUS | Status: AC
  Administered 2023-02-24: 2 via TOPICAL

## 2023-02-24 MED ORDER — LIDOCAINE HCL (PF) 1 % IJ SOLN
INTRAMUSCULAR | Status: AC
  Filled 2023-02-24: qty 60

## 2023-02-24 MED ORDER — SODIUM CHLORIDE 0.9 % IV SOLN: 80.0000 mg | INTRAVENOUS | Status: AC

## 2023-02-24 MED ORDER — ONDANSETRON HCL 4 MG/2ML IJ SOLN: 4.0000 mg | Freq: Four times a day (QID) | INTRAMUSCULAR | Status: DC | PRN

## 2023-02-24 MED ORDER — CHLORHEXIDINE GLUCONATE 4 % EX SOLN
4.0000 | Freq: Once | CUTANEOUS | Status: AC
  Administered 2023-02-24: 4 via TOPICAL
  Filled 2023-02-24: qty 60

## 2023-02-24 MED ORDER — MIDAZOLAM HCL 5 MG/5ML IJ SOLN
INTRAMUSCULAR | Status: AC
  Filled 2023-02-24: qty 5

## 2023-02-24 MED ORDER — FENTANYL CITRATE (PF) 100 MCG/2ML IJ SOLN
INTRAMUSCULAR | Status: AC
  Filled 2023-02-24: qty 2

## 2023-02-24 MED ORDER — ACETAMINOPHEN 325 MG PO TABS
325.0000 mg | ORAL_TABLET | ORAL | Status: DC | PRN
  Administered 2023-02-24: 650 mg via ORAL
  Filled 2023-02-24: qty 2

## 2023-02-24 MED ORDER — LIDOCAINE HCL (PF) 1 % IJ SOLN
INTRAMUSCULAR | Status: DC | PRN
  Administered 2023-02-24: 50 mL

## 2023-02-24 MED ORDER — SODIUM CHLORIDE 0.9 % IV SOLN: INTRAVENOUS | Status: DC

## 2023-02-24 MED ORDER — LIDOCAINE HCL (PF) 1 % IJ SOLN
INTRAMUSCULAR | Status: AC
  Filled 2023-02-24: qty 30

## 2023-02-24 MED ORDER — CEFAZOLIN SODIUM-DEXTROSE 2-4 GM/100ML-% IV SOLN: 2.0000 g | INTRAVENOUS | Status: AC

## 2023-02-24 MED ORDER — FENTANYL CITRATE (PF) 100 MCG/2ML IJ SOLN
INTRAMUSCULAR | Status: DC | PRN
  Administered 2023-02-24: 50 ug via INTRAVENOUS
  Administered 2023-02-24 (×3): 25 ug via INTRAVENOUS

## 2023-02-24 MED ORDER — CEFAZOLIN SODIUM-DEXTROSE 2-4 GM/100ML-% IV SOLN
INTRAVENOUS | Status: AC
  Administered 2023-02-24: 2 g via INTRAVENOUS
  Filled 2023-02-24: qty 100

## 2023-02-24 MED ORDER — HEPARIN (PORCINE) IN NACL 1000-0.9 UT/500ML-% IV SOLN
INTRAVENOUS | Status: DC | PRN
  Administered 2023-02-24: 500 mL

## 2023-02-24 MED ORDER — SODIUM CHLORIDE 0.9 % IV SOLN
INTRAVENOUS | Status: AC
  Administered 2023-02-24: 80 mg
  Filled 2023-02-24: qty 2

## 2023-02-24 SURGICAL SUPPLY — 12 items
CABLE SURGICAL S-101-97-12 (CABLE) ×1 IMPLANT
CATH RIGHTSITE C315HIS02 (CATHETERS) IMPLANT
LEAD SELECT SECURE 3830 383069 (Lead) IMPLANT
LEAD ULTIPACE 58 LPA1231/58 (Lead) IMPLANT
MAT PREVALON FULL STRYKER (MISCELLANEOUS) IMPLANT
PACEMAKER ASSURITY DR-RF (Pacemaker) IMPLANT
PAD DEFIB RADIO PHYSIO CONN (PAD) ×1 IMPLANT
SELECT SECURE 3830 383069 (Lead) ×1 IMPLANT
SHEATH 7FR PRELUDE SNAP 13 (SHEATH) IMPLANT
SLITTER 6232ADJ (MISCELLANEOUS) IMPLANT
TRAY PACEMAKER INSERTION (PACKS) ×1 IMPLANT
WIRE HI TORQ VERSACORE-J 145CM (WIRE) IMPLANT

## 2023-02-24 NOTE — H&P (Signed)
Patient Care Team: Delma Officer, [place] as PCP - General (Internal Medicine) Maisie Fus, MD as PCP - Cardiology (Cardiology) Duke Salvia, MD as PCP - Electrophysiology (Cardiology)   HPI  Connor King is a 72 y.o. male ADMITTED for pacemaker implantation For symptomatic sinus node dysfunction and AV block with 2 AVB and intermittent CHB with narrow QQRS escape   Also with Cor Art Calcification but without Cor Art obstruction  Continues to struggle with fatigue Also with dizziness upon bending and with exertion   Records and Results Reviewed   Past Medical History:  Diagnosis Date   Anticoagulant long-term use    xarelto---  managed by pcp (dr Kirby Funk)  takes for hx dvt/ pe's   Diabetes mellitus type 2, diet-controlled (HCC)    followed by pcp--- per pt does not check cbg at home   First degree heart block    History of colon cancer 06-23-2019  per pt no recurrance   Stage I  --  02-10-2002  s/p  sigmoid colectomy with primary anastomosis/ small bowel resection (diverticulitis)/ appendectomy   History of diverticulitis of colon    History of DVT of lower extremity    11/ 2002  right lower leg   History of kidney stones    History of pulmonary embolus (PE)    09/ 2017  bilateral PE's--- resolved--- long term xarelto   Hyperlipidemia    OSA on CPAP    Ureteral calculi    left    Past Surgical History:  Procedure Laterality Date   CHOLECYSTECTOMY  07/25/2012   Procedure: LAPAROSCOPIC CHOLECYSTECTOMY WITH INTRAOPERATIVE CHOLANGIOGRAM;  Surgeon: Mariella Saa, MD;  Location: [facility];  Service: General;  Laterality: N/A;   COLONOSCOPY WITH PROPOFOL N/A 04/01/2017   Procedure: COLONOSCOPY WITH PROPOFOL;  Surgeon: Bernette Redbird, MD;  Location: [facility];  Service: Endoscopy;  Laterality: N/A;   CYSTOSCOPY/RETROGRADE/URETEROSCOPY/STONE EXTRACTION WITH BASKET Left 06/25/2019   Procedure: CYSTOSCOPY/RETROGRADE/URETEROSCOPY/STONE EXTRACTION WITH  BASKET/ POSSIBLE STENT PLACEMENT;  Surgeon: Ihor Gully, MD;  Location: [facility];  Service: Urology;  Laterality: Left;   EXTRACORPOREAL SHOCK WAVE LITHOTRIPSY Left 04/26/2019   Procedure: EXTRACORPOREAL SHOCK WAVE LITHOTRIPSY (ESWL);  Surgeon: Rene Paci, MD;  Location: [facility];  Service: Urology;  Laterality: Left;   LAPAROSCOPIC ASSISTED VENTRAL HERNIA REPAIR  02-21-2005    dr Derrell Lolling  @[facility]    w/  lysis adhesions   LITHOTRIPSY     over30 years ago   PARTIAL COLECTOMY  02-10-2002   dr Derrell Lolling @[facility]    Sigmoid colectomy w/ primary anastomosis/  small bowel resection for meckel's diverticulitis/  appendectomy   TENDON REPAIR Right 2011   open right knee   TOTAL KNEE ARTHROPLASTY Left 08-09-2009  dr graves  @[facility]     Current Facility-Administered Medications  Medication Dose Route Frequency Provider Last Rate Last Admin   0.9 %  sodium chloride infusion   Intravenous Continuous Duke Salvia, MD       0.9 %  sodium chloride infusion   Intravenous Continuous Duke Salvia, MD 50 mL/hr at 02/24/23 0907 New Bag at 02/24/23 0907   ceFAZolin (ANCEF) IVPB 2g/100 mL premix  2 g Intravenous On Call Duke Salvia, MD       chlorhexidine (HIBICLENS) 4 % liquid 4 Application  4 Application Topical Once Duke Salvia, MD       gentamicin (GARAMYCIN) 80 mg in sodium chloride 0.9 % 500 mL irrigation  80 mg Irrigation On Call Duke Salvia, MD       povidone-iodine 10 % swab 2 Application  2 Application Topical Once Duke Salvia, MD        No Known Allergies    Social History   Tobacco Use   Smoking status: Never   Smokeless tobacco: Never  Vaping Use   Vaping Use: Never used  Substance Use Topics   Alcohol use: No   Drug use: Never     No family history on file.   Current Meds  Medication Sig   atorvastatin (LIPITOR) 10 MG tablet Take 10 mg by mouth in the morning.   Cholecalciferol (VITAMIN D) 50 MCG (2000 UT) CAPS Take 2,000 Units by mouth in the  morning.   Cyanocobalamin (VITAMIN B 12 PO) Take 1,000 mcg by mouth 3 (three) times a week.   DULoxetine (CYMBALTA) 30 MG capsule Take 30 mg by mouth every evening.   gabapentin (NEURONTIN) 300 MG capsule Take 600 mg by mouth every evening.   metFORMIN (GLUCOPHAGE-XR) 500 MG 24 hr tablet Take 500 mg by mouth every evening.   MOUNJARO 7.5 MG/0.5ML Pen Inject 7.5 mg into the skin every Monday.   rivaroxaban (XARELTO) 20 MG TABS tablet Take 20 mg by mouth daily.     Review of Systems negative except from HPI and PMH  Physical Exam Pulse (!) 49   Temp 98.6 F (37 C) (Tympanic)   Resp 16   Ht 6\' 2"  (1.88 m)   Wt 117 kg   SpO2 98%   BMI 33.13 kg/m  Well developed and well nourished in no acute distress HENT normal E scleral and icterus clear Neck Supple JVP flat; carotids brisk and full Clear to ausculation Slow and irregular rate and rhythm, no murmurs gallops or rub Soft with active bowel sounds No clubbing cyanosis  Edema Alert and oriented, grossly normal motor and sensory function Skin Warm and Dry    Assessment and  Plan AV nodal conduction system disease progressive   Fatigue and dyspnea   Obesity on Mounjaro  DM    History of pulmonary embolism on rivaroxaban   Coronary artery calcification   Dizziness    For pacing for bradycardia  The benefits and risks were reviewed including but not limited to death,  perforation, infection, lead dislodgement and device malfunction.  The patient understands agrees and is willing to proceed.  Dizziness with exertion might be rhythm related, but I dont think bending LH would be and in setting of DM I suspect orthostatic hypotension--will check prior to discharge.

## 2023-02-24 NOTE — Discharge Instructions (Signed)
After Your Cardiac Device   You have a [employer] Cardiac Device  ACTIVITY Do not lift your arm above shoulder height for 1 week after your procedure. After 7 days, you may progress as below.  You should remove your sling 24 hours after your procedure, unless otherwise instructed by your provider.     Monday March 03, 2023  Tuesday March 04, 2023 Wednesday March 05, 2023 Thursday March 06, 2023   Do not lift, push, pull, or carry anything over 10 pounds with the affected arm until 6 weeks (Monday April 07, 2023 ) after your procedure.   You may drive AFTER your wound check, unless you have been told otherwise by your provider.   Ask your healthcare provider when you can go back to work   INCISION/Dressing If you are on a blood thinner such as  Xarelto,  this should be resumed. 6/28  If large square, outer bandage is left in place, this can be removed after 24 hours from your procedure. Do not remove steri-strips or glue as below.   If a PRESSURE DRESSING (a bulky dressing that usually goes up over your shoulder) was applied or left in place, please follow instructions given by your provider on when to return to have this removed.   Monitor your cardiac device site for redness, swelling, and drainage. Call the device clinic at [phone] if you experience these symptoms or fever/chills.  If your incision is closed with Dermabond/Surgical glue. You may shower 1 day after your cardiac device implant and wash around the site with soap and water.    If you were discharged in a sling, please do not wear this during the day more than 48 hours after your surgery unless otherwise instructed. This may increase the risk of stiffness and soreness in your shoulder.   Avoid lotions, ointments, or perfumes over your incision until it is well-healed.  You may use a hot tub or a pool AFTER your wound check appointment if the incision is completely closed.   DEVICE MANAGEMENT You should receive  your ID card for your new device in 4-8 weeks. Keep this card with you at all times once received. Consider wearing a medical alert bracelet or necklace.  Please follow manufacture instructions for keeping your device charged carefully. This should be performed every 2 weeks at the very least.   Your cardiac device may be MRI compatible. This will be discussed at your next office visit/wound check.  You should avoid contact with strong electric or magnetic fields.   Do not use amateur (ham) radio equipment or electric (arc) welding torches. MP3 player headphones with magnets should not be used. Some devices are safe to use if held at least 12 inches (30 cm) from your cardiac device. These include power tools, lawn mowers, and speakers. If you are unsure if something is safe to use, ask your health care provider.  When using your cell phone, hold it to the ear that is on the opposite side from the cardiac device. Do not leave your cell phone in a pocket over the cardiac device.  You may safely use electric blankets, heating pads, computers, and microwave ovens.  Call the office right away if: You have chest pain. You feel more short of breath than you have felt before. You feel more light-headed than you have felt before. Your incision starts to open up.  This information is not intended to replace advice given to you by your health care provider.  Make sure you discuss any questions you have with your health care provider.

## 2023-02-25 ENCOUNTER — Encounter (HOSPITAL_COMMUNITY): Payer: Self-pay | Admitting: Internal Medicine

## 2023-02-26 ENCOUNTER — Telehealth: Payer: Self-pay

## 2023-02-26 NOTE — Telephone Encounter (Signed)
-----   Message from Sheilah Pigeon, [place] sent at 02/24/2023  2:14 PM EDT ----- [employer] PPM  Same day discharge SK  Resume Xarelto 6/28  [place]

## 2023-02-26 NOTE — Telephone Encounter (Signed)
Follow-up after same day discharge: Implant date: 02/24/2023 MD: Sherryl Manges, MD Device: [facility]. [employer] [address] MRI Location: Left Chest   Wound check visit: 03/12/23 @ 10:00 AM 90 day MD follow-up: 06/02/23 @ 2:00 PM  Remote Transmission received:Yes  Dressing/sling removed: Sling removed. Patient has dermabond.   Confirm OAC restart on: Xarelto on 02/28/23.

## 2023-02-28 ENCOUNTER — Encounter: Payer: Self-pay | Admitting: Internal Medicine

## 2023-03-10 ENCOUNTER — Encounter: Payer: Medicare PPO | Admitting: Physician Assistant

## 2023-03-12 ENCOUNTER — Ambulatory Visit: Payer: Medicare PPO | Attending: Physician Assistant

## 2023-03-12 DIAGNOSIS — I4430 Unspecified atrioventricular block: Secondary | ICD-10-CM | POA: Diagnosis not present

## 2023-03-12 DIAGNOSIS — R001 Bradycardia, unspecified: Secondary | ICD-10-CM

## 2023-03-12 LAB — CUP PACEART INCLINIC DEVICE CHECK
Battery Remaining Longevity: 73 mo
Battery Voltage: 3.02 V
Brady Statistic RA Percent Paced: 21 %
Brady Statistic RV Percent Paced: 99.11 %
Date Time Interrogation Session: 20240710134915
Implantable Lead Connection Status: 753985
Implantable Lead Connection Status: 753985
Implantable Lead Implant Date: 20240624
Implantable Lead Implant Date: 20240624
Implantable Lead Location: 753859
Implantable Lead Location: 753860
Implantable Lead Model: 3830
Implantable Pulse Generator Implant Date: 20240624
Lead Channel Impedance Value: 550 Ohm
Lead Channel Impedance Value: 562.5 Ohm
Lead Channel Pacing Threshold Amplitude: 0.75 V
Lead Channel Pacing Threshold Amplitude: 0.75 V
Lead Channel Pacing Threshold Amplitude: 0.75 V
Lead Channel Pacing Threshold Amplitude: 0.75 V
Lead Channel Pacing Threshold Pulse Width: 0.5 ms
Lead Channel Pacing Threshold Pulse Width: 0.5 ms
Lead Channel Pacing Threshold Pulse Width: 0.5 ms
Lead Channel Pacing Threshold Pulse Width: 0.5 ms
Lead Channel Sensing Intrinsic Amplitude: 1.8 mV
Lead Channel Sensing Intrinsic Amplitude: 5.8 mV
Lead Channel Setting Pacing Amplitude: 3.5 V
Lead Channel Setting Pacing Amplitude: 3.5 V
Lead Channel Setting Pacing Pulse Width: 0.5 ms
Lead Channel Setting Sensing Sensitivity: 0.5 mV
Pulse Gen Model: 2272
Pulse Gen Serial Number: 8189756

## 2023-03-12 NOTE — Progress Notes (Signed)
Wound check appointment. Steri-strips removed. Wound without redness or edema. Incision edges approximated, wound well healed. Normal device function. Thresholds, sensing, and impedances consistent with implant measurements. Device programmed at 3.5V/auto capture programmed on for extra safety margin until 3 month visit. Histogram distribution appropriate for patient and level of activity. No mode switches or high ventricular rates noted. Patient educated about wound care, arm mobility, lifting restrictions. ROV in 3 months with implanting physician. Changed RV sensitivity to AUTO.

## 2023-03-12 NOTE — Patient Instructions (Signed)
   After Your Pacemaker   Monitor your pacemaker site for redness, swelling, and drainage. Call the device clinic at [phone] if you experience these symptoms or fever/chills.  Your incision was closed with Dermabond:  You may shower 1 day after your defibrillator implant and wash your incision with soap and water. Avoid lotions, ointments, or perfumes over your incision until it is well-healed.  You may use a hot tub or a pool after your wound check appointment if the incision is completely closed.  Do not lift, push or pull greater than 10 pounds with the affected arm until 6 weeks after your procedure. There are no other restrictions in arm movement after your wound check appointment.  You may drive, unless driving has been restricted by your healthcare providers.  Remote monitoring is used to monitor your pacemaker from home. This monitoring is scheduled every 91 days by our office. It allows us to keep an eye on the functioning of your device to ensure it is working properly. You will routinely see your Electrophysiologist annually (more often if necessary).  

## 2023-03-28 ENCOUNTER — Telehealth: Payer: Self-pay

## 2023-03-28 NOTE — Telephone Encounter (Signed)
Spoke with patient informed him that his pacemaker transmission is normal, no episodes to correlate with lightheaded symptoms. Advised patient this if he is truly only drinking 16oz of fluid a day then this not enough and to increase his fluid intake. Patient appreciative of call back

## 2023-03-28 NOTE — Telephone Encounter (Signed)
Transmission received. 03/28/2023. See my-chart message

## 2023-03-28 NOTE — Telephone Encounter (Signed)
I spoke with the pt and he going to call me when he get home.

## 2023-04-14 DIAGNOSIS — G4733 Obstructive sleep apnea (adult) (pediatric): Secondary | ICD-10-CM | POA: Diagnosis not present

## 2023-04-24 DIAGNOSIS — G4733 Obstructive sleep apnea (adult) (pediatric): Secondary | ICD-10-CM | POA: Diagnosis not present

## 2023-05-22 DIAGNOSIS — E1169 Type 2 diabetes mellitus with other specified complication: Secondary | ICD-10-CM | POA: Diagnosis not present

## 2023-05-22 DIAGNOSIS — E781 Pure hyperglyceridemia: Secondary | ICD-10-CM | POA: Diagnosis not present

## 2023-05-22 DIAGNOSIS — I442 Atrioventricular block, complete: Secondary | ICD-10-CM | POA: Diagnosis not present

## 2023-05-22 DIAGNOSIS — G4733 Obstructive sleep apnea (adult) (pediatric): Secondary | ICD-10-CM | POA: Diagnosis not present

## 2023-05-22 DIAGNOSIS — G250 Essential tremor: Secondary | ICD-10-CM | POA: Diagnosis not present

## 2023-05-22 DIAGNOSIS — Z23 Encounter for immunization: Secondary | ICD-10-CM | POA: Diagnosis not present

## 2023-05-22 DIAGNOSIS — Z86711 Personal history of pulmonary embolism: Secondary | ICD-10-CM | POA: Diagnosis not present

## 2023-05-22 DIAGNOSIS — Z1331 Encounter for screening for depression: Secondary | ICD-10-CM | POA: Diagnosis not present

## 2023-05-22 DIAGNOSIS — Z9989 Dependence on other enabling machines and devices: Secondary | ICD-10-CM | POA: Diagnosis not present

## 2023-05-22 DIAGNOSIS — E1142 Type 2 diabetes mellitus with diabetic polyneuropathy: Secondary | ICD-10-CM | POA: Diagnosis not present

## 2023-05-22 DIAGNOSIS — D6869 Other thrombophilia: Secondary | ICD-10-CM | POA: Diagnosis not present

## 2023-05-22 DIAGNOSIS — Z0000 Encounter for general adult medical examination without abnormal findings: Secondary | ICD-10-CM | POA: Diagnosis not present

## 2023-05-22 DIAGNOSIS — Z85038 Personal history of other malignant neoplasm of large intestine: Secondary | ICD-10-CM | POA: Diagnosis not present

## 2023-05-25 DIAGNOSIS — G4733 Obstructive sleep apnea (adult) (pediatric): Secondary | ICD-10-CM | POA: Diagnosis not present

## 2023-05-27 ENCOUNTER — Ambulatory Visit: Payer: Medicare PPO

## 2023-05-27 DIAGNOSIS — I4430 Unspecified atrioventricular block: Secondary | ICD-10-CM | POA: Diagnosis not present

## 2023-05-28 LAB — CUP PACEART REMOTE DEVICE CHECK
Battery Remaining Longevity: 67 mo
Battery Remaining Percentage: 95.5 %
Battery Voltage: 2.98 V
Brady Statistic AP VP Percent: 21 %
Brady Statistic AP VS Percent: 1 %
Brady Statistic AS VP Percent: 77 %
Brady Statistic AS VS Percent: 1 %
Brady Statistic RA Percent Paced: 20 %
Brady Statistic RV Percent Paced: 98 %
Date Time Interrogation Session: 20240924020014
Implantable Lead Connection Status: 753985
Implantable Lead Connection Status: 753985
Implantable Lead Implant Date: 20240624
Implantable Lead Implant Date: 20240624
Implantable Lead Location: 753859
Implantable Lead Location: 753860
Implantable Lead Model: 3830
Implantable Pulse Generator Implant Date: 20240624
Lead Channel Impedance Value: 550 Ohm
Lead Channel Impedance Value: 560 Ohm
Lead Channel Pacing Threshold Amplitude: 0.75 V
Lead Channel Pacing Threshold Amplitude: 0.75 V
Lead Channel Pacing Threshold Pulse Width: 0.5 ms
Lead Channel Pacing Threshold Pulse Width: 0.5 ms
Lead Channel Sensing Intrinsic Amplitude: 2.8 mV
Lead Channel Sensing Intrinsic Amplitude: 3.3 mV
Lead Channel Setting Pacing Amplitude: 3.5 V
Lead Channel Setting Pacing Amplitude: 3.5 V
Lead Channel Setting Pacing Pulse Width: 0.5 ms
Lead Channel Setting Sensing Sensitivity: 0.5 mV
Pulse Gen Model: 2272
Pulse Gen Serial Number: 8189756

## 2023-06-02 ENCOUNTER — Ambulatory Visit: Payer: Medicare PPO | Attending: Internal Medicine | Admitting: Internal Medicine

## 2023-06-02 ENCOUNTER — Encounter: Payer: Self-pay | Admitting: Internal Medicine

## 2023-06-02 VITALS — BP 122/74 | HR 67 | Ht 74.0 in | Wt 269.6 lb

## 2023-06-02 DIAGNOSIS — I442 Atrioventricular block, complete: Secondary | ICD-10-CM | POA: Insufficient documentation

## 2023-06-02 DIAGNOSIS — R001 Bradycardia, unspecified: Secondary | ICD-10-CM | POA: Diagnosis not present

## 2023-06-02 DIAGNOSIS — Z950 Presence of cardiac pacemaker: Secondary | ICD-10-CM | POA: Insufficient documentation

## 2023-06-02 NOTE — Patient Instructions (Signed)
Medication Instructions:  Your physician recommends that you continue on your current medications as directed. Please refer to the Current Medication list given to you today.  *If you need a refill on your cardiac medications before your next appointment, please call your pharmacy*   Lab Work: None ordered.  If you have labs (blood work) drawn today and your tests are completely normal, you will receive your results only by: MyChart Message (if you have MyChart) OR A paper copy in the mail If you have any lab test that is abnormal or we need to change your treatment, we will call you to review the results.   Testing/Procedures: None ordered.    Follow-Up: At  [facility], you and your health needs are our priority.  As part of our continuing mission to provide you with exceptional heart care, we have created designated Provider Care Teams.  These Care Teams include your primary Cardiologist (physician) and Advanced Practice Providers (APPs -  Physician Assistants and Nurse Practitioners) who all work together to provide you with the care you need, when you need it.  We recommend signing up for the patient portal called "MyChart".  Sign up information is provided on this After Visit Summary.  MyChart is used to connect with patients for Virtual Visits (Telemedicine).  Patients are able to view lab/test results, encounter notes, upcoming appointments, etc.  Non-urgent messages can be sent to your provider as well.   To learn more about what you can do with MyChart, go to [email or web address].    Your next appointment:   9 months with Dr [name] 

## 2023-06-02 NOTE — Progress Notes (Signed)
Patient Care Team: Delma Officer, [place] as PCP - General (Internal Medicine) Maisie Fus, MD as PCP - Cardiology (Cardiology) Duke Salvia, MD as PCP - Electrophysiology (Cardiology)   HPI  Connor King is a 72 y.o. male seen in follow-up for left bundle branch area pacing 6/24 undertaken for longstanding profound first-degree AV block resting bradycardia and intermittent second-degree block with progressive fatigue.  Significant improvement in exercise tolerance with less dyspnea and improved peripheral edema  Still with orthostatic lightheadedness intermittently; has improved with attention to hydration.  Has longstanding diabetes with no neuropathy Orthostatic vital signs in the [facility] records were negative (no prolonged standing)    DATE TEST EF    12/23 Echo   60-65 %     2/24 CTA   CaScore 142 FFR neg for obstruction    Date Cr K Hgb LDL  11/23 1.2 4.2 13.6     9/24 1.06  4.4  13.5 48     Records and Results Reviewed NOTES FROM [facility]   Past Medical History:  Diagnosis Date   Anticoagulant long-term use    xarelto---  managed by pcp (dr Kirby Funk)  takes for hx dvt/ pe's   Diabetes mellitus type 2, diet-controlled (HCC)    followed by pcp--- per pt does not check cbg at home   First degree heart block    History of colon cancer 06-23-2019  per pt no recurrance   Stage I  --  02-10-2002  s/p  sigmoid colectomy with primary anastomosis/ small bowel resection (diverticulitis)/ appendectomy   History of diverticulitis of colon    History of DVT of lower extremity    11/ 2002  right lower leg   History of kidney stones    History of pulmonary embolus (PE)    09/ 2017  bilateral PE's--- resolved--- long term xarelto   Hyperlipidemia    OSA on CPAP    Ureteral calculi    left    Past Surgical History:  Procedure Laterality Date   CHOLECYSTECTOMY  07/25/2012   Procedure: LAPAROSCOPIC CHOLECYSTECTOMY WITH INTRAOPERATIVE CHOLANGIOGRAM;   Surgeon: Mariella Saa, MD;  Location: [facility];  Service: General;  Laterality: N/A;   COLONOSCOPY WITH PROPOFOL N/A 04/01/2017   Procedure: COLONOSCOPY WITH PROPOFOL;  Surgeon: Bernette Redbird, MD;  Location: [facility];  Service: Endoscopy;  Laterality: N/A;   CYSTOSCOPY/RETROGRADE/URETEROSCOPY/STONE EXTRACTION WITH BASKET Left 06/25/2019   Procedure: CYSTOSCOPY/RETROGRADE/URETEROSCOPY/STONE EXTRACTION WITH BASKET/ POSSIBLE STENT PLACEMENT;  Surgeon: Ihor Gully, MD;  Location: [facility] [place];  Service: Urology;  Laterality: Left;   EXTRACORPOREAL SHOCK WAVE LITHOTRIPSY Left 04/26/2019   Procedure: EXTRACORPOREAL SHOCK WAVE LITHOTRIPSY (ESWL);  Surgeon: Rene Paci, MD;  Location: [facility];  Service: Urology;  Laterality: Left;   LAPAROSCOPIC ASSISTED VENTRAL HERNIA REPAIR  02-21-2005    dr Derrell Lolling  @[facility]    w/  lysis adhesions   LITHOTRIPSY     over30 years ago   PACEMAKER IMPLANT N/A 02/24/2023   Procedure: PACEMAKER IMPLANT;  Surgeon: Duke Salvia, MD;  Location: [facility];  Service: Cardiovascular;  Laterality: N/A;   PARTIAL COLECTOMY  02-10-2002   dr Derrell Lolling @[facility]    Sigmoid colectomy w/ primary anastomosis/  small bowel resection for meckel's diverticulitis/  appendectomy   TENDON REPAIR Right 2011   open right knee   TOTAL KNEE ARTHROPLASTY Left 08-09-2009  dr graves  @[facility]     Current Meds  Medication Sig  acetaminophen (TYLENOL) 500 MG tablet Take 1,000 mg by mouth every 6 (six) hours as needed (pain/headache.).   amoxicillin (AMOXIL) 500 MG tablet Take 2,000 mg by mouth See admin instructions. Take 4 capsules (2000 mg) by mouth 1 hour prior to dental appointments   atorvastatin (LIPITOR) 10 MG tablet Take 10 mg by mouth in the morning.   Cholecalciferol (VITAMIN D) 50 MCG (2000 UT) CAPS Take 2,000 Units by mouth in the morning.   Cyanocobalamin (VITAMIN B 12 PO) Take 1,000 mcg by mouth 3 (three) times a week.   DULoxetine (CYMBALTA) 30 MG  capsule Take 30 mg by mouth every evening.   gabapentin (NEURONTIN) 300 MG capsule Take 300 mg by mouth 2 (two) times daily. Pt takes 300 mg in the morning and 600 mg at night   metFORMIN (GLUCOPHAGE-XR) 500 MG 24 hr tablet Take 500 mg by mouth every evening.   MOUNJARO 7.5 MG/0.5ML Pen Inject 7.5 mg into the skin every Monday.   rivaroxaban (XARELTO) 20 MG TABS tablet Take 20 mg by mouth daily.    No Known Allergies    Review of Systems negative except from HPI and PMH  Physical Exam BP 122/74 (BP Location: Left Arm, Patient Position: Sitting, Cuff Size: Large)   Pulse 67   Ht 6\' 2"  (1.88 m)   Wt 269 lb 9.6 oz (122.3 kg)   SpO2 99%   BMI 34.61 kg/m  Well developed and well nourished in no acute distress HENT normal Neck supple with JVP-flat Clear Device pocket well healed; without hematoma or erythema.  There is no tethering  Regular rate and rhythm, no  gallop No  murmur Abd-soft with active BS No Clubbing cyanosis 2+ edema Skin-warm and dry A & Oriented  Grossly normal sensory and motor function  ECG E sinus at 67  21/16/46  Device function is normal. Programming changes LONG TERM OUTPUTS  See Paceart for details      CrCl cannot be calculated (Patient's most recent lab result is older than the maximum 21 days allowed.).   Assessment and  Plan  AV nodal conduction system disease progressive  Pacemaker-left bundle branch area-[employer]   Fatigue and dyspnea  Orthostatic lightheadedness   History of pulmonary embolism on rivaroxaban   Coronary artery calcification     Improved following pacing.  Edema is also improved, will hold off on diuretics given the orthostasis.  Discussed the physiology  Continue rivaroxaban  On statin with LDL at target no need of aspirin given the rivaroxaban   Current medicines are reviewed at length with the patient today .  The patient does not have concerns regarding medicines.

## 2023-06-04 LAB — CUP PACEART INCLINIC DEVICE CHECK
Date Time Interrogation Session: 20240930070244
Implantable Lead Connection Status: 753985
Implantable Lead Connection Status: 753985
Implantable Lead Implant Date: 20240624
Implantable Lead Implant Date: 20240624
Implantable Lead Location: 753859
Implantable Lead Location: 753860
Implantable Lead Model: 3830
Implantable Pulse Generator Implant Date: 20240624
Lead Channel Pacing Threshold Amplitude: 1 V
Lead Channel Pacing Threshold Amplitude: 2.5 V
Lead Channel Pacing Threshold Pulse Width: 0.5 ms
Lead Channel Pacing Threshold Pulse Width: 0.5 ms
Lead Channel Setting Pacing Amplitude: 2 V
Lead Channel Setting Pacing Amplitude: 2.5 V
Lead Channel Setting Pacing Pulse Width: 0.5 ms
Lead Channel Setting Sensing Sensitivity: 0.5 mV
Pulse Gen Model: 2272
Pulse Gen Serial Number: 8189756

## 2023-06-12 DIAGNOSIS — G4733 Obstructive sleep apnea (adult) (pediatric): Secondary | ICD-10-CM | POA: Diagnosis not present

## 2023-06-13 NOTE — Progress Notes (Signed)
Remote pacemaker transmission.   

## 2023-06-24 DIAGNOSIS — G4733 Obstructive sleep apnea (adult) (pediatric): Secondary | ICD-10-CM | POA: Diagnosis not present

## 2023-07-25 DIAGNOSIS — G4733 Obstructive sleep apnea (adult) (pediatric): Secondary | ICD-10-CM | POA: Diagnosis not present

## 2023-08-05 DIAGNOSIS — H52203 Unspecified astigmatism, bilateral: Secondary | ICD-10-CM | POA: Diagnosis not present

## 2023-08-05 DIAGNOSIS — H31003 Unspecified chorioretinal scars, bilateral: Secondary | ICD-10-CM | POA: Diagnosis not present

## 2023-08-05 DIAGNOSIS — H524 Presbyopia: Secondary | ICD-10-CM | POA: Diagnosis not present

## 2023-08-05 DIAGNOSIS — E119 Type 2 diabetes mellitus without complications: Secondary | ICD-10-CM | POA: Diagnosis not present

## 2023-08-24 DIAGNOSIS — G4733 Obstructive sleep apnea (adult) (pediatric): Secondary | ICD-10-CM | POA: Diagnosis not present

## 2023-08-26 ENCOUNTER — Ambulatory Visit (INDEPENDENT_AMBULATORY_CARE_PROVIDER_SITE_OTHER): Payer: Medicare PPO

## 2023-08-26 DIAGNOSIS — R001 Bradycardia, unspecified: Secondary | ICD-10-CM

## 2023-08-26 DIAGNOSIS — I442 Atrioventricular block, complete: Secondary | ICD-10-CM

## 2023-08-26 LAB — CUP PACEART REMOTE DEVICE CHECK
Battery Remaining Longevity: 101 mo
Battery Remaining Percentage: 95.5 %
Battery Voltage: 2.99 V
Brady Statistic AP VP Percent: 22 %
Brady Statistic AP VS Percent: 1 %
Brady Statistic AS VP Percent: 77 %
Brady Statistic AS VS Percent: 1 %
Brady Statistic RA Percent Paced: 21 %
Brady Statistic RV Percent Paced: 98 %
Date Time Interrogation Session: 20241224020015
Implantable Lead Connection Status: 753985
Implantable Lead Connection Status: 753985
Implantable Lead Implant Date: 20240624
Implantable Lead Implant Date: 20240624
Implantable Lead Location: 753859
Implantable Lead Location: 753860
Implantable Lead Model: 3830
Implantable Pulse Generator Implant Date: 20240624
Lead Channel Impedance Value: 530 Ohm
Lead Channel Impedance Value: 560 Ohm
Lead Channel Pacing Threshold Amplitude: 0.75 V
Lead Channel Pacing Threshold Amplitude: 1 V
Lead Channel Pacing Threshold Pulse Width: 0.5 ms
Lead Channel Pacing Threshold Pulse Width: 0.5 ms
Lead Channel Sensing Intrinsic Amplitude: 2.8 mV
Lead Channel Sensing Intrinsic Amplitude: 3.2 mV
Lead Channel Setting Pacing Amplitude: 2 V
Lead Channel Setting Pacing Amplitude: 2.5 V
Lead Channel Setting Pacing Pulse Width: 0.5 ms
Lead Channel Setting Sensing Sensitivity: 0.5 mV
Pulse Gen Model: 2272
Pulse Gen Serial Number: 8189756

## 2023-09-09 DIAGNOSIS — G4733 Obstructive sleep apnea (adult) (pediatric): Secondary | ICD-10-CM | POA: Diagnosis not present

## 2023-09-24 DIAGNOSIS — Z85038 Personal history of other malignant neoplasm of large intestine: Secondary | ICD-10-CM | POA: Diagnosis not present

## 2023-09-24 DIAGNOSIS — G4733 Obstructive sleep apnea (adult) (pediatric): Secondary | ICD-10-CM | POA: Diagnosis not present

## 2023-09-24 DIAGNOSIS — D6869 Other thrombophilia: Secondary | ICD-10-CM | POA: Diagnosis not present

## 2023-09-24 DIAGNOSIS — I442 Atrioventricular block, complete: Secondary | ICD-10-CM | POA: Diagnosis not present

## 2023-09-24 DIAGNOSIS — E1169 Type 2 diabetes mellitus with other specified complication: Secondary | ICD-10-CM | POA: Diagnosis not present

## 2023-09-24 DIAGNOSIS — E1142 Type 2 diabetes mellitus with diabetic polyneuropathy: Secondary | ICD-10-CM | POA: Diagnosis not present

## 2023-09-24 DIAGNOSIS — Z86711 Personal history of pulmonary embolism: Secondary | ICD-10-CM | POA: Diagnosis not present

## 2023-10-01 ENCOUNTER — Encounter: Payer: Self-pay | Admitting: Internal Medicine

## 2023-10-02 ENCOUNTER — Other Ambulatory Visit: Payer: Self-pay | Admitting: Gastroenterology

## 2023-10-02 DIAGNOSIS — Z860101 Personal history of adenomatous and serrated colon polyps: Secondary | ICD-10-CM | POA: Diagnosis not present

## 2023-10-02 DIAGNOSIS — Z7901 Long term (current) use of anticoagulants: Secondary | ICD-10-CM | POA: Diagnosis not present

## 2023-10-02 DIAGNOSIS — Z85038 Personal history of other malignant neoplasm of large intestine: Secondary | ICD-10-CM | POA: Diagnosis not present

## 2023-10-02 DIAGNOSIS — Z950 Presence of cardiac pacemaker: Secondary | ICD-10-CM | POA: Diagnosis not present

## 2023-10-03 ENCOUNTER — Telehealth: Payer: Self-pay | Admitting: Internal Medicine

## 2023-10-03 NOTE — Telephone Encounter (Signed)
   Pre-operative Risk Assessment    Patient Name: Connor King  DOB: 1950-09-13 MRN: [id number]   Date of last office visit: 06/02/2023 Date of next office visit: none   Request for Surgical Clearance    Procedure:   colonoscopy  Date of Surgery:  Clearance 11/04/23                                Surgeon:  Dr. Kerin Salen Surgeon's Group or Practice Name:  [facility] Phone number:  [phone] Fax number:  [phone]   Type of Clearance Requested:   - Medical  - Pharmacy:  Hold Rivaroxaban (Xarelto)     Type of Anesthesia:   propofol   Additional requests/questions:    Sharen Hones   10/03/2023, 3:47 PM

## 2023-10-03 NOTE — Telephone Encounter (Signed)
   Name: Connor King  DOB: [birth year]  MRN: [id number]  Primary Cardiologist: Maisie Fus, MD  Chart reviewed as part of pre-operative protocol coverage. Because of Hassen F Seckel's past medical history and time since last visit, he will require a follow-up. Pt is overdue for follow-up with general cardiology.   Pre-op covering staff: - Please schedule appointment and call patient to inform them. If patient already had an upcoming appointment within acceptable timeframe, please add "pre-op clearance" to the appointment notes so provider is aware. - Please contact requesting surgeon's office via preferred method (i.e, phone, fax) to inform them of need for appointment prior to surgery.  This message will also be routed to pharmacy pool for input on holding Xarelto as requested below so that this information is available to the clearing provider at time of patient's appointment.   Joylene Grapes, NP  10/03/2023, 3:56 PM

## 2023-10-04 NOTE — Telephone Encounter (Signed)
Patient with diagnosis of VTE on Xarelto for anticoagulation.    Procedure:   colonoscopy   Date of Surgery:  Clearance 11/04/23  CrCl 105 Platelet count 184  Per chart had bilateral PE 2017, DVT 11/22  Per office protocol, patient can hold Xarelto for 1 days prior to procedure.   Patient will not need bridging with Lovenox (enoxaparin) around procedure.  **This guidance is not considered finalized until pre-operative APP has relayed final recommendations.**

## 2023-10-06 NOTE — Telephone Encounter (Signed)
Pt scheduled to see Edd Fabian, NP, 10/14/23, clearance will be addressed at that time  Will route to the requesting surgeon's office to make them aware.

## 2023-10-14 ENCOUNTER — Encounter: Payer: Self-pay | Admitting: Nurse Practitioner

## 2023-10-14 ENCOUNTER — Ambulatory Visit: Payer: Medicare PPO | Attending: Nurse Practitioner | Admitting: Nurse Practitioner

## 2023-10-14 VITALS — BP 114/76 | HR 63 | Ht 74.0 in | Wt 260.2 lb

## 2023-10-14 DIAGNOSIS — G4733 Obstructive sleep apnea (adult) (pediatric): Secondary | ICD-10-CM | POA: Diagnosis not present

## 2023-10-14 DIAGNOSIS — Z950 Presence of cardiac pacemaker: Secondary | ICD-10-CM | POA: Diagnosis not present

## 2023-10-14 DIAGNOSIS — Z01810 Encounter for preprocedural cardiovascular examination: Secondary | ICD-10-CM | POA: Diagnosis not present

## 2023-10-14 DIAGNOSIS — Z86711 Personal history of pulmonary embolism: Secondary | ICD-10-CM

## 2023-10-14 DIAGNOSIS — R001 Bradycardia, unspecified: Secondary | ICD-10-CM | POA: Diagnosis not present

## 2023-10-14 DIAGNOSIS — E785 Hyperlipidemia, unspecified: Secondary | ICD-10-CM

## 2023-10-14 DIAGNOSIS — I442 Atrioventricular block, complete: Secondary | ICD-10-CM | POA: Diagnosis not present

## 2023-10-14 DIAGNOSIS — Z136 Encounter for screening for cardiovascular disorders: Secondary | ICD-10-CM

## 2023-10-14 DIAGNOSIS — I2510 Atherosclerotic heart disease of native coronary artery without angina pectoris: Secondary | ICD-10-CM | POA: Diagnosis not present

## 2023-10-14 DIAGNOSIS — E118 Type 2 diabetes mellitus with unspecified complications: Secondary | ICD-10-CM | POA: Diagnosis not present

## 2023-10-14 NOTE — Patient Instructions (Signed)
Medication Instructions:  Your physician recommends that you continue on your current medications as directed. Please refer to the Current Medication list given to you today.  *If you need a refill on your cardiac medications before your next appointment, please call your pharmacy*   Lab Work: NONE ordered at this time of appointment   Testing/Procedures: NONE ordered at this time of appointment   Follow-Up: At [facility], you and your health needs are our priority.  As part of our continuing mission to provide you with exceptional heart care, we have created designated Provider Care Teams.  These Care Teams include your primary Cardiologist (physician) and Advanced Practice Providers (APPs -  Physician Assistants and Nurse Practitioners) who all work together to provide you with the care you need, when you need it.  We recommend signing up for the patient portal called "MyChart".  Sign up information is provided on this After Visit Summary.  MyChart is used to connect with patients for Virtual Visits (Telemedicine).  Patients are able to view lab/test results, encounter notes, upcoming appointments, etc.  Non-urgent messages can be sent to your provider as well.   To learn more about what you can do with MyChart, go to [email or web address].    Your next appointment:    June 2025 (EP)  Provider:   Dr. Graciela Husbands

## 2023-10-14 NOTE — Progress Notes (Unsigned)
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   Past Medical History    Past Medical History:  Diagnosis Date   Anticoagulant long-term use    xarelto---  managed by pcp (dr Kirby Funk)  takes for hx dvt/ pe's   Diabetes mellitus type 2, diet-controlled (HCC)    followed by pcp--- per pt does not check cbg at home   First degree heart block    History of colon cancer 06-23-2019  per pt no recurrance   Stage I  --  02-10-2002  s/p  sigmoid colectomy with primary anastomosis/ small bowel resection (diverticulitis)/ appendectomy   History of diverticulitis of colon    History of DVT of lower extremity    11/ 2002  right lower leg   History of kidney stones    History of pulmonary embolus (PE)    09/ 2017  bilateral PE's--- resolved--- long term xarelto   Hyperlipidemia    OSA on CPAP    Ureteral calculi    left   Past Surgical History:  Procedure Laterality Date   CHOLECYSTECTOMY  07/25/2012   Procedure: LAPAROSCOPIC CHOLECYSTECTOMY WITH INTRAOPERATIVE CHOLANGIOGRAM;  Surgeon: Mariella Saa, MD;  Location: [facility];  Service: General;  Laterality: N/A;   COLONOSCOPY WITH PROPOFOL N/A 04/01/2017   Procedure: COLONOSCOPY WITH PROPOFOL;  Surgeon: Bernette Redbird, MD;  Location: [facility];  Service: Endoscopy;  Laterality: N/A;   CYSTOSCOPY/RETROGRADE/URETEROSCOPY/STONE EXTRACTION WITH BASKET Left 06/25/2019   Procedure: CYSTOSCOPY/RETROGRADE/URETEROSCOPY/STONE  EXTRACTION WITH BASKET/ POSSIBLE STENT PLACEMENT;  Surgeon: Ihor Gully, MD;  Location: [facility];  Service: Urology;  Laterality: Left;   EXTRACORPOREAL SHOCK WAVE LITHOTRIPSY Left 04/26/2019   Procedure: EXTRACORPOREAL SHOCK WAVE LITHOTRIPSY (ESWL);  Surgeon: Rene Paci, MD;  Location: [facility];  Service: Urology;  Laterality: Left;   LAPAROSCOPIC ASSISTED VENTRAL HERNIA REPAIR  02-21-2005    dr Derrell Lolling  @[facility]    w/  lysis adhesions   LITHOTRIPSY     over30 years ago   PACEMAKER IMPLANT N/A 02/24/2023   Procedure: PACEMAKER IMPLANT;  Surgeon: Duke Salvia, MD;  Location: [facility];  Service: Cardiovascular;  Laterality: N/A;   PARTIAL COLECTOMY  02-10-2002   dr Derrell Lolling @[facility]    Sigmoid colectomy w/ primary anastomosis/  small bowel resection for meckel's diverticulitis/  appendectomy   TENDON REPAIR Right 2011   open right knee   TOTAL KNEE ARTHROPLASTY Left 08-09-2009  dr graves  @[facility]     Allergies  No Known Allergies   Labs/Other Studies Reviewed    The following studies were reviewed today:  Cardiac Studies & Procedures   ______________________________________________________________________________________________     ECHOCARDIOGRAM  ECHOCARDIOGRAM COMPLETE 08/06/2022  Narrative ECHOCARDIOGRAM REPORT    Patient Name:   Connor King Date of Exam: 08/06/2022 Medical Rec #:  [id number]       Height:       74.0 in Accession #:    [id number]      Weight:       281.2 lb Date of Birth:  11-23-1950  BSA:          2.512 m Patient Age:    71 years        BP:           118/80 mmHg Patient Gender: M               HR:           77 bpm. Exam Location:  [address]  Procedure: 2D Echo, Cardiac Doppler and Color Doppler  Indications:    R94.31 Abnormal EKG  History:        Patient has prior history of Echocardiogram examinations, most recent 05/19/2016. Abnormal ECG, Arrythmias:Bradycardia and 1st Degree Heart Block; Risk  Factors:Dyslipidemia, Diabetes and Sleep Apnea. History of Pulmonary Embolism (2017), Palpitations.  Sonographer:    Farrel Conners RDCS Referring Phys: Maisie Fus  IMPRESSIONS   1. Left ventricular ejection fraction, by estimation, is 60 to 65%. The left ventricle has normal function. The left ventricle has no regional wall motion abnormalities. Left ventricular diastolic function could not be evaluated. 2. Right ventricular systolic function is normal. The right ventricular size is mildly enlarged. There is normal pulmonary artery systolic pressure. The estimated right ventricular systolic pressure is 29.0 mmHg. 3. Left atrial size was severely dilated. 4. Right atrial size was severely dilated. 5. The mitral valve is normal in structure. Trivial mitral valve regurgitation. No evidence of mitral stenosis. 6. The aortic valve is normal in structure. Aortic valve regurgitation is trivial. No aortic stenosis is present. 7. Aortic dilatation noted. There is mild dilatation of the aortic root, measuring 42 mm. There is mild dilatation of the ascending aorta, measuring 41 mm. 8. The inferior vena cava is normal in size with greater than 50% respiratory variability, suggesting right atrial pressure of 3 mmHg.  FINDINGS Left Ventricle: Left ventricular ejection fraction, by estimation, is 60 to 65%. The left ventricle has normal function. The left ventricle has no regional wall motion abnormalities. The left ventricular internal cavity size was normal in size. There is no left ventricular hypertrophy. Left ventricular diastolic function could not be evaluated due to atrial fibrillation. Left ventricular diastolic function could not be evaluated.  Right Ventricle: The right ventricular size is mildly enlarged. No increase in right ventricular wall thickness. Right ventricular systolic function is normal. There is normal pulmonary artery systolic pressure. The tricuspid regurgitant velocity is  2.55 m/s, and with an assumed right atrial pressure of 3 mmHg, the estimated right ventricular systolic pressure is 29.0 mmHg.  Left Atrium: Left atrial size was severely dilated.  Right Atrium: Right atrial size was severely dilated.  Pericardium: There is no evidence of pericardial effusion.  Mitral Valve: The mitral valve is normal in structure. Trivial mitral valve regurgitation. No evidence of mitral valve stenosis.  Tricuspid Valve: The tricuspid valve is normal in structure. Tricuspid valve regurgitation is mild . No evidence of tricuspid stenosis.  Aortic Valve: The aortic valve is normal in structure. Aortic valve regurgitation is trivial. No aortic stenosis is present.  Pulmonic Valve: The pulmonic valve was normal in structure. Pulmonic valve regurgitation is not visualized. No evidence of pulmonic stenosis.  Aorta: Aortic dilatation noted. There is mild dilatation of the aortic root, measuring 42 mm. There is mild dilatation of the ascending aorta, measuring 41 mm.  Venous: The inferior vena cava is normal in size with greater than 50% respiratory variability, suggesting right atrial pressure of 3 mmHg.  IAS/Shunts: No atrial level shunt detected by color flow  Doppler.   LEFT VENTRICLE PLAX 2D LVIDd:         5.00 cm   Diastology LVIDs:         3.00 cm   LV e' medial:    12.30 cm/s LV PW:         1.10 cm   LV E/e' medial:  7.2 LV IVS:        0.90 cm   LV e' lateral:   22.40 cm/s LVOT diam:     2.80 cm   LV E/e' lateral: 3.9 LV SV:         114 LV SV Index:   45 LVOT Area:     6.16 cm   RIGHT VENTRICLE RV Basal diam:  4.50 cm RV Mid diam:    4.00 cm RV S prime:     11.65 cm/s TAPSE (M-mode): 1.5 cm  LEFT ATRIUM              Index        RIGHT ATRIUM           Index LA diam:        4.90 cm  1.95 cm/m   RA Area:     31.70 cm LA Vol (A2C):   86.6 ml  34.47 ml/m  RA Volume:   110.00 ml 43.79 ml/m LA Vol (A4C):   107.0 ml 42.59 ml/m LA Biplane Vol: 105.0 ml  41.80 ml/m AORTIC VALVE LVOT Vmax:   100.00 cm/s LVOT Vmean:  65.000 cm/s LVOT VTI:    0.185 m  AORTA Ao Root diam: 4.20 cm Ao Asc diam:  4.10 cm  MITRAL VALVE               TRICUSPID VALVE MV Area (PHT): cm         TR Peak grad:   26.0 mmHg MV Decel Time: 158 msec    TR Vmax:        255.00 cm/s MV E velocity: 88.10 cm/s SHUNTS Systemic VTI:  0.18 m Systemic Diam: 2.80 cm  Mihai Croitoru MD Electronically signed by Thurmon Fair MD Signature Date/Time: 08/06/2022/12:28:53 PM    Final    MONITORS  LONG TERM MONITOR-LIVE TELEMETRY (3-14 DAYS) 10/28/2022  Narrative Patch Wear Time:  13 days and 21 hours (2024-01-31T18:08:32-0500 to 2024-02-13T10:16:42-0500)  Monitor 1 Patient had a min HR of 34 bpm, max HR of 179 bpm, and avg HR of 59 bpm. Predominant underlying rhythm was Sinus Rhythm. First Degree AV Block was present. 1 run of Ventricular Tachycardia occurred lasting 5 beats with a max rate of 179 bpm (avg 132 bpm). 2696 episode(s) of AV Block (3rd) occurred, lasting a total of 18 hours 55 mins. Second Degree AV Block-Mobitz I (Wenckebach) was present. AV Block (3rd) and Wenckebach were detected within +/- 45 seconds of symptomatic patient event(s). No Isolated SVEs, SVE Couplets, or SVE Triplets were present. Isolated VEs were rare (<1.0%, 1923), VE Couplets were rare (<1.0%, 18), and VE Triplets were rare (<1.0%, 2). Ventricular Bigeminy and Trigeminy were present. Previously notified: MD notification criteria for Complete Heart Block met - notified Dr. Lujean Rave on 03 Oct 2022 at 12:11 AM CST ([facility]).  Monitor 2 Patient had a min HR of 37 bpm, max HR of 194 bpm, and avg HR of 60 bpm. Predominant underlying rhythm was Sinus Rhythm. First Degree AV Block was present. 1 run of Ventricular Tachycardia occurred lasting 4 beats with a max rate of 194 bpm (avg 158 bpm). 436  episode(s) of AV Block (3rd) occurred, lasting a total of 2 hours 25 mins. Second Degree AV Block-Mobitz  I (Wenckebach) was present. No Isolated SVEs, SVE Couplets, or SVE Triplets were present. Isolated VEs were rare (<1.0%), and no VE Couplets or VE Triplets were present.  Monitor 3 Patient had a min HR of 33 bpm, max HR of 148 bpm, and avg HR of 58 bpm. Predominant underlying rhythm was Sinus Rhythm. First Degree AV Block was present. 2826 episode(s) of AV Block (3rd) occurred, lasting a total of 1 day 4 hours. Second Degree AV Block-Mobitz I (Wenckebach) was present. Isolated SVEs were rare (<1.0%), and no SVE Couplets or SVE Triplets were present. Isolated VEs were rare (<1.0%, 1936), VE Couplets were rare (<1.0%, 13), and VE Triplets were rare (<1.0%, 1). Ventricular Bigeminy was present.   Reviewed with patient Heart block Resonable but probably somewhat limited chronotropic incompetence   CT SCANS  CT CORONARY MORPH W/CTA COR W/SCORE 10/18/2022  Addendum 10/18/2022  4:20 PM ADDENDUM REPORT: 10/18/2022 16:18  EXAM: OVER-READ INTERPRETATION  CT CHEST  The following report is an over-read performed by radiologist Dr. Kela Millin [facility] on 10/18/2022. This over-read does not include interpretation of cardiac or coronary anatomy or pathology. The cardiac and coronary CTA interpretation by the cardiologist is attached.  COMPARISON:  CT chest dated May 16, 2016.  FINDINGS: Vascular: There are no significant non-cardiac vascular findings.  Mediastinum/Nodes: There are no enlarged lymph nodes.The visualized esophagus demonstrates no significant findings.  Lungs/Pleura: Clear lungs. No pneumothorax or pleural effusion.  Upper abdomen: No acute abnormality.  Musculoskeletal/Chest wall: Bilateral gynecomastia. No acute or significant osseous findings.  IMPRESSION: 1. No significant extracardiac findings.   Electronically Signed By: Obie Dredge M.D. On: 10/18/2022 16:18  Addendum 10/18/2022  1:41 PM ADDENDUM REPORT: 10/18/2022  13:38  CLINICAL DATA:  18M with bradycardia and exertional dyspnea.  EXAM: Cardiac/Coronary  CT  TECHNIQUE: The patient was scanned on a [employer].  FINDINGS: A 120 kV prospective scan was triggered in the descending thoracic aorta at 111 HU's. Axial non-contrast 3 mm slices were carried out through the heart. The data set was analyzed on a dedicated work station and scored using the Agatson method. Gantry rotation speed was 250 msecs and collimation was .6 mm. No beta blockade and 0.8 mg of sl NTG was given. The 3D data set was reconstructed in 5% intervals of the 67-82 % of the R-R cycle. Diastolic phases were analyzed on a dedicated work station using MPR, MIP and VRT modes. The patient received 80 cc of contrast.  Aorta: Normal size.  No calcifications.  No dissection.  Aortic Valve:  Trileaflet.  No calcifications.  Coronary Arteries:  Normal coronary origin.  Right dominance.  RCA is a large dominant artery that gives rise to PDA and PLVB. There is mild (25-49%) soft plaque proximally and mild (25-49%) calcified plaque in the mid RCA.  Left main is a large artery that gives rise to LAD and LCX arteries.  LAD is a large vessel that has minimal (< 25%) calcified plaque in the proximal and mid LAD. D1 and D2 have no plaque.  LCX is a non-dominant artery that gives rise to one large branching OM1. There is no plaque.  Coronary Calcium Score:  Left main: 0  Left anterior descending artery: 72  Left circumflex artery: 0  Right coronary artery: 70  Total: 142  Percentile: 45th  Other findings:  Normal pulmonary vein drainage  into the left atrium.  Normal let atrial appendage without a thrombus.  Normal size of the pulmonary artery.  IMPRESSION: 1. Coronary calcium score of 142. This was 45th percentile for age-, race-, and sex-matched controls.  2. Normal coronary origin with right dominance.  3.  There mild (25-49%) plaque in the RCA.   CAD-RADS 2.  4. The RCA is not well-visualized due to motion. Will send study for FFRct.  Chilton Si, MD   Electronically Signed By: Chilton Si M.D. On: 10/18/2022 13:38  Narrative CLINICAL DATA:  45M with atrial fibrillation scheduled for an ablation.  EXAM: Cardiac CT/CTA  TECHNIQUE: The patient was scanned on a [employer] scanner.  FINDINGS: A 120 kV prospective scan was triggered in the descending thoracic aorta at 111 HU's. Gantry rotation speed was 280 msecs and collimation was .9 mm. No beta blockade and no NTG was given. The 3D data set was reconstructed in 5% intervals of the 60-80 % of the R-R cycle. Diastolic phases were analyzed on a dedicated work station using MPR, MIP and VRT modes. The patient received 80 cc of contrast.  There is normal pulmonary vein drainage into the left atrium (2 on the right and 2 on the left) with ostial measurements as follows:  RUPV: 16.1 x 11.8 mm  RLPV: 18.6 x 11.2 mm  LUPV: 24.9 x 14.3 mm  LLPV: 16.0 x 14.8 mm  The left atrial appendage is large broccoli type with two lobes and ostial size 24.4 x 14.2 mm and length 29 mm. There is no thrombus in the left atrial appendage.  The esophagus runs in the left atrial midline and is closest to the lefu upper and lower pulmonary veins.  Aorta: Normal caliber. Ascending aorta 3.5 cm. No dissection or calcifications.  Aortic Valve:  Trileaflet.  No calcifications.  Coronary Arteries: Normal coronary origin. Right dominance. The study was performed without use of NTG and insufficient for plaque evaluation. Coronary calcium score 142. This is 45th percentile for age, race and gender.  IMPRESSION: 1. There is normal pulmonary vein drainage into the left atrium.  2. The left atrial appendage is large broccoli type with two lobes and ostial size 24.4 x 14.2 mm and length 29 mm. There is no thrombus in the left atrial appendage.  3. The esophagus runs in  the left atrial midline and is not in the proximity to any of the pulmonary veins.  4. Coronary calcium score 142. This is 45th percentile for age, race and gender.  Chilton Si, MD  Electronically Signed: By: Chilton Si M.D. 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   He was referred to cardiology in the setting of bradycardia, HR in the 40s bpm.  He was last seen by general cardiology on 07/08/2022.  Monitor in 07/2022 showed predominantly sinus rhythm, first-degree AV block, 1 run of ventricular tachycardia, 802 episodes of third-degree AV block, second-degree AV block Mobitz type I.  Echocardiogram in 08/2022 showed EF 60 to  65%, normal LV function, no RWMA, normal RV systolic function, severely dilated right atrium, no significant valvular abnormalities, mild dilation of the aortic root measuring 42 mm, mild dilation of the ascending aorta measuring 41 mm.  Coronary CT angiogram in 10/2022 showed coronary calcium score of 142 (45th percentile), nonobstructive CAD. He has primarily follow-up with EP since this time.  He underwent PPM implantation for symptomatic bradycardia in 02/2023.  He was last seen by Dr. Graciela Husbands on 06/02/2023 and was stable overall from a cardiac standpoint.  He noted occasional orthostatic lightheadedness, significant  improvement in exercise tolerance following PPM.  He presents today for follow-up and for preoperative cardiac evaluation for upcoming colonoscopy scheduled for 11/04/2023 with Dr. Carole Binning of [facility] gastro[facility]ology with request to hold Xarelto prior to procedure. Since his last visit he has done well from a cardiac standpoint.  He notes rare orthostatic dizziness, significantly improved, he denies symptoms concerning for angina, denies presyncope, syncope.  Overall, he reports feeling well.    Home Medications    Current Outpatient Medications  Medication Sig Dispense Refill   acetaminophen (TYLENOL) 500 MG tablet Take 1,000 mg by mouth every 6 (six) hours as needed (pain/headache.).     amoxicillin (AMOXIL) 500 MG tablet Take 2,000 mg by mouth See admin instructions. Take 4 capsules (2000 mg) by mouth 1 hour prior to dental appointments     atorvastatin (LIPITOR) 10 MG tablet Take 10 mg by mouth in the morning.     Cholecalciferol (VITAMIN D) 50 MCG (2000 UT) CAPS Take 2,000 Units by mouth in the morning.     Cyanocobalamin (VITAMIN B 12 PO) Take 1,000 mcg by mouth 3 (three) times a week.     DULoxetine (CYMBALTA) 30 MG capsule Take 30 mg by mouth every evening.     gabapentin (NEURONTIN) 300 MG capsule Take 600 mg by mouth every evening.     gabapentin (NEURONTIN) 300 MG capsule Take 300 mg by mouth 2 (two) times daily. Pt takes 300 mg in the morning and 600 mg at night     metFORMIN (GLUCOPHAGE-XR) 500 MG 24 hr tablet Take 500 mg by mouth every evening.     MOUNJARO 7.5 MG/0.5ML Pen Inject 10 mg into the skin every Monday.     rivaroxaban (XARELTO) 20 MG TABS tablet Take 20 mg by mouth daily.     No current facility-administered medications for this visit.     Review of Systems    He denies chest pain, palpitations, dyspnea, pnd, orthopnea, n, v, dizziness, syncope, edema, weight gain, or early satiety. All other systems reviewed and are otherwise negative except as noted above.    Physical Exam    VS:  BP 114/76 (BP Location: Left Arm, Patient Position: Sitting, Cuff Size: Large)   Pulse 63   Ht 6\' 2"  (1.88 m)   Wt 260 lb 3.2 oz (118 kg)   SpO2 98%   BMI 33.41 kg/m  GEN: Well nourished, well developed, in no acute distress. HEENT: normal. Neck: Supple, no JVD, carotid bruits, or masses. Cardiac: RRR, no murmurs, rubs, or gallops. No clubbing, cyanosis, edema.  Radials/DP/PT 2+ and equal bilaterally.  Respiratory:  Respirations regular and unlabored, clear to auscultation bilaterally. GI: Soft, nontender, nondistended, BS + x 4. MS: no deformity or atrophy. Skin: warm and dry, no rash. Neuro:  Strength and sensation are intact. Psych: Normal affect.  Accessory Clinical Findings    ECG personally reviewed by me today - EKG Interpretation Date/Time:  Tuesday  October 14 2023 13:56:26 EST Ventricular Rate:  63 PR Interval:  176 QRS Duration:  156 QT Interval:  490 QTC Calculation: 501 R Axis:   -20  Text Interpretation: AV dual-paced rhythm When compared with ECG of 02-Jun-2023 14:06, Vent. rate has decreased BY   4 BPM Confirmed by Bernadene Person ([id number]) on 10/14/2023 2:07:20 PM  - no acute changes.   Lab Results  Component Value Date   WBC 6.1 02/24/2023   HGB 15.0 02/24/2023   HCT 44.0 02/24/2023   MCV 93.8 02/24/2023   PLT 184 02/24/2023   Lab Results  Component Value Date   CREATININE 1.10 02/24/2023   BUN 25 (H) 02/24/2023   NA 142 02/24/2023   K 3.9 02/24/2023   CL 105 02/24/2023   CO2 27 02/24/2023   Lab Results  Component Value Date   ALT 14 (L) 05/16/2016   AST 20 05/16/2016   ALKPHOS 75 05/16/2016   BILITOT 0.9 05/16/2016   No results found for: "CHOL", "HDL", "LDLCALC", "LDLDIRECT", "TRIG", "CHOLHDL"  Lab Results  Component Value Date   HGBA1C 6.8 (H) 05/16/2016    Assessment & Plan    1. Nonobstructive CAD: Coronary CT angiogram in 10/2022 showed coronary calcium score of 142 (45th percentile), nonobstructive CAD.  Stable with no anginal symptoms. No indication for ischemic evaluation.  Continue Lipitor.  2. Symptomatic bradycardia: S/p PPM.  Functional status improved significantly following pacemaker.  Following with EP.  3. Orthostatic hypotension/dizziness: Significantly improved, denies any recent dizziness, presyncope or syncope.  He is not on any antihypertensive medication at this time.  4. History of PE: Continue Xarelto.  5. Hyperlipidemia: LDL was 48 in 05/2023.  Continue Lipitor.  6. Type 2 diabetes: A1c was 5.6 on 05/2019.  Monitor managed per PCP.  7. OSA: Adherent to CPAP.   8. Preoperative cardiac exam: According to the Revised Cardiac Risk Index (RCRI), his Perioperative Risk of Major Cardiac Event is (%): 0.4. His Functional Capacity in METs is: 7.01 according to the [employer] Activity Status Index (DASI).Therefore, based on ACC/AHA guidelines, patient would be at acceptable risk for the planned procedure without further cardiovascular testing. Per office protocol, patient can hold Xarelto for 1 days prior to procedure.  Patient will not need bridging with Lovenox (enoxaparin) around procedure.Please resume Xarelto as soon as possible postprocedure, at the discretion of the surgeon.  I will route this recommendation to the requesting party via Epic fax function.  9. Disposition: Follow-up per recall with Dr. Graciela Husbands in 02/2024.  Follow-up with general cardiology as needed.      Joylene Grapes, NP 10/14/2023, 2:07 PM

## 2023-10-15 ENCOUNTER — Encounter: Payer: Self-pay | Admitting: Nurse Practitioner

## 2023-10-17 DIAGNOSIS — Z03818 Encounter for observation for suspected exposure to other biological agents ruled out: Secondary | ICD-10-CM | POA: Diagnosis not present

## 2023-10-17 DIAGNOSIS — R051 Acute cough: Secondary | ICD-10-CM | POA: Diagnosis not present

## 2023-10-17 DIAGNOSIS — J069 Acute upper respiratory infection, unspecified: Secondary | ICD-10-CM | POA: Diagnosis not present

## 2023-10-23 ENCOUNTER — Telehealth: Payer: Self-pay | Admitting: Internal Medicine

## 2023-10-23 NOTE — Telephone Encounter (Signed)
   Pre-operative Risk Assessment    Patient Name: Connor King  DOB: Nov 27, 1950 MRN: [id number]  Date of last office visit:10/14/23 Date of next office visit: 9  MONTHS   Request for Surgical Clearance    Procedure:   COLONOSCOPY Date of Surgery:  Clearance 11/04/23                              Surgeon:  DR Kerin Salen Surgeon's Group or Practice Name:  [facility] Phone number:  [phone] Fax number:  [phone] Type of Clearance Requested:   PHARMACY XARELTO-HOLD 3 DAYS BEFORE PROCEDURE BECAUSE PATIENT MAY HAVE POLYPS THAT MAY NEED REMOVING  Type of Anesthesia:  Not Indicated  Additional requests/questions:    Jorja Loa   10/23/2023, 9:55 AM

## 2023-10-23 NOTE — Telephone Encounter (Signed)
   Patient Name: Connor King  DOB: 09-30-1950 MRN: [id number]  Primary Cardiologist: Maisie Fus, MD  Chart reviewed as part of pre-operative protocol coverage. Given past medical history and time since last visit, based on ACC/AHA guidelines, [name] is at acceptable risk for the planned procedure without further cardiovascular testing.   Preoperative cardiac exam: According to the Revised Cardiac Risk Index (RCRI), his Perioperative Risk of Major Cardiac Event is (%): 0.4. His Functional Capacity in METs is: 7.01 according to the Duke Activity Status Index (DASI).Therefore, based on ACC/AHA guidelines, patient would be at acceptable risk for the planned procedure without further cardiovascular testing. Per office protocol, patient can hold Xarelto for 1 days prior to procedure.  Patient will not need bridging with Lovenox (enoxaparin) around procedure.Please resume Xarelto as soon as possible postprocedure, at the discretion of the surgeon.  I will route this recommendation to the requesting party via Epic fax function.   The patient was advised that if he develops new symptoms prior to surgery to contact our office to arrange for a follow-up visit, and he verbalized understanding.  I will route this recommendation to the requesting party via Epic fax function and remove from pre-op pool.  Please call with questions.  Joni Reining, NP 10/23/2023, 1:30 PM

## 2023-10-25 DIAGNOSIS — G4733 Obstructive sleep apnea (adult) (pediatric): Secondary | ICD-10-CM | POA: Diagnosis not present

## 2023-10-28 ENCOUNTER — Encounter (HOSPITAL_COMMUNITY): Payer: Self-pay | Admitting: Gastroenterology

## 2023-10-29 ENCOUNTER — Encounter: Payer: Self-pay | Admitting: Pharmacist

## 2023-10-29 NOTE — Telephone Encounter (Signed)
 Is ok to hold for 2 days but holding 3 days for a colonoscopy is outside of our protocol. Will need approval from cardiologist

## 2023-10-29 NOTE — Telephone Encounter (Signed)
 Connor King from [facility] is requesting a callback at her direct line [phone]  regarding their request to have Xarelto- Held 3 days before procedure because patient may have Polyps that may need removing. She stated they can see the note in EPIC "Per office protocol, patient can hold Xarelto for 1 days prior to procedure." But it really needs to be 3 days. Please advise

## 2023-10-29 NOTE — Telephone Encounter (Signed)
 I will forward this to the preop APP and preop pharm-d as well to see notes from GI office in regard to Xarelto hold time.

## 2023-10-30 NOTE — Telephone Encounter (Signed)
   Patient Name: Connor King  DOB: 1951-04-22 MRN: [id number]  Primary Cardiologist: Maisie Fus, MD  Chart reviewed as part of pre-operative protocol coverage. Given past medical history and time since last visit, based on ACC/AHA guidelines, [name] is at acceptable risk for the planned procedure without further cardiovascular testing.   Per Dr. Wyline Mood, pt may hold Xarelto fro 3 days prior to colonoscopy. Please resume Xarelto as soon as possible postprocedure, at the discretion of the surgeon.    I will route this recommendation to the requesting party via Epic fax function and remove from pre-op pool.  Please call with questions.  Joylene Grapes, NP 10/30/2023, 12:47 PM

## 2023-11-03 NOTE — H&P (Signed)
 History of Present Illness This is a 73 year old patient, here today to set up a repeat colonoscopy.   Medical history includes type 2 diabetes, severe obstructive sleep apnea, morbid obesity, DVT, pulmonary emboli, adenocarcinoma of the colon, diverticulitis, kidney stones, tremor, pacemaker placement in June 2024 due to complete heart block.  Echo 2023, EF 60-65%.   GI History: Colonoscopy: 04/01/17, d/t history of colon cancer, tubular adenoma, repeat in recommended in 5 years.   Alarm Symptoms: Anemia: no, hgb 13.5 on most recent labs Unintentional Weight Loss: Denies Change in Appetite: Denies Change in Bowel Habits: Denies Trouble Swallowing: Denies Blood in Stool: Denies Black Stools: Denies  Abdominal Pain: Denies Diarrhea/Constipation: Denies N/V: Denies Reflux: Denies Belching or abdominal bloating: Denies NSAID Use: Denies Blood Thinners: Xarelto Major medical events over past 6 months: pacemaker placement in June of 2024 Cardiologist: Carolan Clines, MD  Family History of Colon Cancer: Denies.  Current Medications Accu-Chek Guide(Glucose Blood) - Strip as directed In Vitro once a day (E11.69) Accu-Chek Guide(Blood Glucose Monitoring Suppl) w/Device Kit as directed to check BG once a day (E11.69) Accu-Chek Softclix Lancets(Lancets) - Miscellaneous as directed to check BG once a day (E11.69) Atorvastatin Calcium 10 MG Tablet 1 tablet Orally Once a day DULoxetine HCl 30 MG Capsule Delayed Release Particles 1 capsule Orally Once a day Gabapentin 300 MG Capsule 1 capsule in morning, 2 in evening Orally twice a day metFORMIN HCl ER 500 MG Tablet Extended Release 24 Hour 1 tablet with evening meal Orally every evening with dinner Mounjaro(Tirzepatide) 10 MG/0.5ML Solution Pen-injector 0.57mL 10 mg Subcutaneous once per week Vitamin B12 1000 MCG Tablet Extended Release 1 tablet Orally three times a week Vitamin D-3 1000 UNIT Capsule 2000 units Orally Once a  day Xarelto(Rivaroxaban) 20 MG Tablet TAKE 1 TABLET BY MOUTH ONCE DAILY WITH FOOD FOR 90 DAYS Orally Once a day Medication List reviewed and reconciled with the patient  Past Medical History Diabetes mellitus type 2 diagnosed 2017/peripheral neuropathy 2022. severe obstructive sleep apnea, 4/05,CPAP 14. Morbid obesity. right calf DVT November 2002. Bilateral pulmonary emboli 2017. Adenocarcinoma of the colon, sigmoid resection 2003, sdap 2012. history Meckel's diverticulitis. Renal stones with lithotripsy 1993. 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 Microhematuria w/u 2019 nephrolithiasis. Covid 19 10/21, covid 19 2/23. Tremor. derm dan jones, ophtho gould, gi buccini, dentist-[facility]. pacemaker secondary to complete heart block Dr. Graciela Husbands 02/2023.  Surgical History renal stone lithotripsy 1993 sigmoid resection, Meckel's resection and incidental appendectomy, Derrell Lolling 2003 incarcerated ventral hernia with laparoscopic lysis of adhesions and ventral hernia repair, Derrell Lolling June 2006 excision basosquamous cell carcinoma, left upper back, [facility] June 2009 L knee replacement, Graves 12/10 R knee quad tendon complete tear, Graves 6/11 Lap chole, [facility] 11/13 Colonoscopy 2003/'04/'07/'12/'18 left ureteroscopy, laser lithotripsy, basketing and stent,ottelin October 2020 Sept left eye, OCT right eye 05/2021 R TKR, graves 01/2022 pacemaker Dr. Graciela Husbands 02/2023  Family History Father: deceased, died of lung cancer, diagnosed with Lung Cancer, COPD (chronic obstructive pulmonary disease) Mother: deceased, diagnosed with Lung Cancer Brother 1: alive 1 brother(s) . 1 son(s) , 1 daughter(s) . No Family History of Colon Cancer, Polyps, kidney or Liver Disease.   Social History    General:  Tobacco use  cigarettes:  Never smoked, Tobacco history last updated  10/02/2023, Additional Findings: Tobacco Non-User   Non-smoker for personal reasons, Vaping  No. EXPOSURE TO PASSIVE SMOKE: no. Alcohol: no, no. Recreational drug  use: no. Exercise: cardio 3x a week, wts 1 hr. Marital Status: Married. OCCUPATION: Arts development officer reviews retired. Smoking: no.  Allergies N.K.D.A. Hospitalization/Major Diagnostic Procedure Pulmonary embolism 05/2016 pacemaker Dr Graciela Husbands 02/2023 None in the past year 09/2023  Vital Signs Wt: 260.4, Wt change: 0.6 lbs, Ht: 72, BMI: 35.31, Temp: 98.1, Pulse sitting: 68, BP sitting: 110/74. Examination  GENERAL APPEARANCE:  Well developed, well nourished, no active distress, pleasant, no acute distress .  PSYCHIATRIC  Alert and oriented x3, mood and affect appear normal..   Assessments 1. History of colon cancer - Z85.038 (Primary)    2. Hx of adenomatous colonic polyps - Z86.0101    3. Cardiac pacemaker - Z95.0    Treatment 1. History of colon cancer      IMAGING: Colonoscopy (Ordered for 10/02/2023) Notes: Mick Sell 10/02/2023 03:25:31 PM > Pt scheduled for 11/04/2023 with Dr.Syra Sirmons at [facility]. Patient Annette Stable of Rights, Consent Form, Instructions, Insurance forms and RX for prep given to patient.     Notes: No alarm symptoms reported today. Discussed risk of colonoscopy including perforation, bleeding, infection. All questions regarding procedure answered today. Order placed for colonoscopy to be completed at [facility]. Cardiac clearance sent to patient's cardiologist, Dr. Carolan Clines, due to pacemaker placement in June 2024. Request to hold xarelto sent to patient's PCP, as this is who prescribes this medication.    2. Hx of adenomatous colonic polyps      IMAGING: Colonoscopy (Ordered for 10/02/2023) Notes: Mick Sell 10/02/2023 03:25:31 PM > Pt scheduled for 11/04/2023 with Dr.Trentan Trippe at [facility]. Patient Annette Stable of Rights, Consent Form, Instructions, Insurance forms and RX for prep given to patient.     Notes: No alarm symptoms reported today. Discussed  risk of colonoscopy including perforation, bleeding, infection. All questions regarding procedure answered today. Order placed for colonoscopy to be completed at [facility]. Cardiac clearance sent to patient's cardiologist, Dr. Carolan Clines, due to pacemaker placement in June 2024. Request to hold xarelto sent to patient's PCP, as this is who prescribes this medication.    3. Cardiac pacemaker        Notes: No alarm symptoms reported today. Discussed risk of colonoscopy including perforation, bleeding, infection. All questions regarding procedure answered today. Order placed for colonoscopy to be completed at [facility]. Cardiac clearance sent to patient's cardiologist, Dr. Carolan Clines, due to pacemaker placement in June 2024. Request to hold xarelto sent to patient's PCP, as this is who prescribes this medication.

## 2023-11-03 NOTE — Anesthesia Preprocedure Evaluation (Addendum)
 Anesthesia Evaluation  Patient identified by MRN, date of birth, ID band Patient awake    Reviewed: Allergy & Precautions, NPO status , Patient's Chart, lab work & pertinent test results  Airway Mallampati: II  TM Distance: >3 FB     Dental   Pulmonary sleep apnea    breath sounds clear to auscultation       Cardiovascular + DVT  + dysrhythmias + pacemaker  Rhythm:Regular Rate:Normal     Neuro/Psych    GI/Hepatic Neg liver ROS, hiatal hernia,,,  Endo/Other  diabetes, Type 2  Class 4 obesity  Renal/GU negative Renal ROS     Musculoskeletal   Abdominal  (+) + obese  Peds  Hematology   Anesthesia Other Findings   Reproductive/Obstetrics                             Anesthesia Physical Anesthesia Plan  ASA: 3  Anesthesia Plan: MAC   Post-op Pain Management: Minimal or no pain anticipated   Induction: Intravenous  PONV Risk Score and Plan: 1 and Propofol infusion  Airway Management Planned: Simple Face Mask and Natural Airway  Additional Equipment: None  Intra-op Plan:   Post-operative Plan:   Informed Consent: I have reviewed the patients History and Physical, chart, labs and discussed the procedure including the risks, benefits and alternatives for the proposed anesthesia with the patient or authorized representative who has indicated his/her understanding and acceptance.     Dental advisory given  Plan Discussed with: CRNA and Anesthesiologist  Anesthesia Plan Comments:         Anesthesia Quick Evaluation

## 2023-11-04 ENCOUNTER — Encounter (HOSPITAL_COMMUNITY)
Admission: RE | Disposition: A | Discharge status: Home / Self Care | Payer: Self-pay | Source: Home / Self Care | Attending: Gastroenterology

## 2023-11-04 ENCOUNTER — Other Ambulatory Visit: Payer: Self-pay

## 2023-11-04 ENCOUNTER — Ambulatory Visit (HOSPITAL_BASED_OUTPATIENT_CLINIC_OR_DEPARTMENT_OTHER): Payer: Self-pay | Admitting: Anesthesiology

## 2023-11-04 ENCOUNTER — Encounter (HOSPITAL_COMMUNITY): Payer: Self-pay | Admitting: Gastroenterology

## 2023-11-04 ENCOUNTER — Ambulatory Visit (HOSPITAL_COMMUNITY)
Admission: RE | Admit: 2023-11-04 | Discharge: 2023-11-04 | Disposition: A | Discharge status: Home / Self Care | Payer: Medicare PPO | Attending: Gastroenterology | Admitting: Gastroenterology

## 2023-11-04 ENCOUNTER — Ambulatory Visit (HOSPITAL_COMMUNITY): Payer: Self-pay | Admitting: Anesthesiology

## 2023-11-04 DIAGNOSIS — K5730 Diverticulosis of large intestine without perforation or abscess without bleeding: Secondary | ICD-10-CM | POA: Diagnosis not present

## 2023-11-04 DIAGNOSIS — D128 Benign neoplasm of rectum: Secondary | ICD-10-CM | POA: Diagnosis not present

## 2023-11-04 DIAGNOSIS — Z85038 Personal history of other malignant neoplasm of large intestine: Secondary | ICD-10-CM | POA: Diagnosis not present

## 2023-11-04 DIAGNOSIS — D124 Benign neoplasm of descending colon: Secondary | ICD-10-CM | POA: Insufficient documentation

## 2023-11-04 DIAGNOSIS — Z6835 Body mass index (BMI) 35.0-35.9, adult: Secondary | ICD-10-CM | POA: Diagnosis not present

## 2023-11-04 DIAGNOSIS — D122 Benign neoplasm of ascending colon: Secondary | ICD-10-CM | POA: Insufficient documentation

## 2023-11-04 DIAGNOSIS — Z86718 Personal history of other venous thrombosis and embolism: Secondary | ICD-10-CM | POA: Insufficient documentation

## 2023-11-04 DIAGNOSIS — E119 Type 2 diabetes mellitus without complications: Secondary | ICD-10-CM | POA: Diagnosis not present

## 2023-11-04 DIAGNOSIS — Z09 Encounter for follow-up examination after completed treatment for conditions other than malignant neoplasm: Secondary | ICD-10-CM | POA: Diagnosis not present

## 2023-11-04 DIAGNOSIS — Z950 Presence of cardiac pacemaker: Secondary | ICD-10-CM | POA: Diagnosis not present

## 2023-11-04 DIAGNOSIS — Z86711 Personal history of pulmonary embolism: Secondary | ICD-10-CM | POA: Insufficient documentation

## 2023-11-04 DIAGNOSIS — Z7901 Long term (current) use of anticoagulants: Secondary | ICD-10-CM | POA: Insufficient documentation

## 2023-11-04 DIAGNOSIS — G4733 Obstructive sleep apnea (adult) (pediatric): Secondary | ICD-10-CM | POA: Insufficient documentation

## 2023-11-04 DIAGNOSIS — K648 Other hemorrhoids: Secondary | ICD-10-CM | POA: Diagnosis not present

## 2023-11-04 DIAGNOSIS — Z1211 Encounter for screening for malignant neoplasm of colon: Secondary | ICD-10-CM

## 2023-11-04 DIAGNOSIS — Z7984 Long term (current) use of oral hypoglycemic drugs: Secondary | ICD-10-CM | POA: Insufficient documentation

## 2023-11-04 DIAGNOSIS — Z800 Family history of malignant neoplasm of digestive organs: Secondary | ICD-10-CM | POA: Diagnosis not present

## 2023-11-04 DIAGNOSIS — Z7985 Long-term (current) use of injectable non-insulin antidiabetic drugs: Secondary | ICD-10-CM | POA: Diagnosis not present

## 2023-11-04 DIAGNOSIS — Z860101 Personal history of adenomatous and serrated colon polyps: Secondary | ICD-10-CM | POA: Diagnosis not present

## 2023-11-04 HISTORY — PX: HEMOSTASIS CLIP PLACEMENT: SHX6857

## 2023-11-04 HISTORY — PX: COLONOSCOPY WITH PROPOFOL: SHX5780

## 2023-11-04 HISTORY — PX: POLYPECTOMY: SHX5525

## 2023-11-04 LAB — GLUCOSE, CAPILLARY: Glucose-Capillary: 83 mg/dL (ref 70–99)

## 2023-11-04 SURGERY — COLONOSCOPY WITH PROPOFOL
Anesthesia: Monitor Anesthesia Care

## 2023-11-04 MED ORDER — PROPOFOL 500 MG/50ML IV EMUL
INTRAVENOUS | Status: AC
  Filled 2023-11-04: qty 50

## 2023-11-04 MED ORDER — GLYCOPYRROLATE PF 0.2 MG/ML IJ SOSY
PREFILLED_SYRINGE | INTRAMUSCULAR | Status: DC | PRN
  Administered 2023-11-04: .1 mg via INTRAVENOUS

## 2023-11-04 MED ORDER — PROPOFOL 10 MG/ML IV BOLUS
INTRAVENOUS | Status: DC | PRN
  Administered 2023-11-04: 50 mg via INTRAVENOUS
  Administered 2023-11-04: 40 mg via INTRAVENOUS
  Administered 2023-11-04 (×3): 50 mg via INTRAVENOUS
  Administered 2023-11-04: 30 mg via INTRAVENOUS
  Administered 2023-11-04: 80 mg via INTRAVENOUS

## 2023-11-04 MED ORDER — SODIUM CHLORIDE 0.9 % IV SOLN: INTRAVENOUS | Status: DC | PRN

## 2023-11-04 MED ORDER — LIDOCAINE 2% (20 MG/ML) 5 ML SYRINGE
INTRAMUSCULAR | Status: DC | PRN
Start: 2023-11-04 — End: 2023-11-04
  Administered 2023-11-04: 40 mg via INTRAVENOUS

## 2023-11-04 SURGICAL SUPPLY — 20 items

## 2023-11-04 NOTE — Op Note (Signed)
 [facility] Patient Name: Connor King Procedure Date: 11/04/2023 MRN: [id number] Attending MD: Kerin Salen , MD, [id number] Date of Birth: May 26, 1951 CSN: [id number] Age: 73 Admit Type: Outpatient Procedure:                Colonoscopy Indications:              Surveillance: Personal history of adenomatous                            polyps on last colonoscopy > 5 years ago, Last                            colonoscopy: 2018, Personal history of malignant                            neoplasm of the colon Providers:                Kerin Salen, MD, Stephens Shire RN, RN, Martha Clan, RN, Rozetta Nunnery, Technician Referring MD:             Georga Hacking Medicines:                Monitored Anesthesia Care Complications:            No immediate complications. Estimated blood loss:                            Minimal. Estimated Blood Loss:     Estimated blood loss was minimal. Procedure:                Pre-Anesthesia Assessment:                           - Prior to the procedure, a History and Physical                            was performed, and patient medications and                            allergies were reviewed. The patient's tolerance of                            previous anesthesia was also reviewed. The risks                            and benefits of the procedure and the sedation                            options and risks were discussed with the patient.                            All questions were answered, and informed consent  was obtained. Prior Anticoagulants: The patient has                            taken Xarelto (rivaroxaban), last dose was 3 days                            prior to procedure. ASA Grade Assessment: III - A                            patient with severe systemic disease. After                            reviewing the risks and benefits, the patient was                             deemed in satisfactory condition to undergo the                            procedure.                           After obtaining informed consent, the colonoscope                            was passed under direct vision. Throughout the                            procedure, the patient's blood pressure, pulse, and                            oxygen saturations were monitored continuously. The                            [id number]-HQ190L ([id number]) Olympus colonoscope was                            introduced through the anus and advanced to the the                            terminal ileum. The colonoscopy was performed                            without difficulty. The patient tolerated the                            procedure well. The quality of the bowel                            preparation was fair. The terminal ileum, ileocecal                            valve, appendiceal orifice, and rectum were  photographed. Scope In: 8:48:56 AM Scope Out: 9:10:42 AM Scope Withdrawal Time: 0 hours 15 minutes 12 seconds  Total Procedure Duration: 0 hours 21 minutes 46 seconds  Findings:      The perianal and digital rectal examinations were normal.      The terminal ileum appeared normal.      An 8 mm polyp was found in the rectum. The polyp was sessile. The polyp       was removed with a cold snare. Resection and retrieval were complete.      A 9 mm polyp was found in the descending colon. The polyp was sessile.       The polyp was removed with a cold snare. Resection and retrieval were       complete. To close a defect after polypectomy, one hemostatic clip was       successfully placed (MR conditional). Clip manufacturer: [employer]. There was no bleeding during, or at the end, of the       procedure.      An 8 mm polyp was found in the ascending colon. The polyp was sessile.       The polyp was removed with a cold snare. Resection and retrieval were        complete. To close a defect after polypectomy, two hemostatic clips were       deployed, 1 clip successfully placed (MR conditional). Clip       manufacturer: [employer]. There was no bleeding at the end of the       procedure.      Multiple medium-mouthed diverticula were found in the recto-sigmoid       colon, sigmoid colon, descending colon and transverse colon.      Non-bleeding internal hemorrhoids were found during retroflexion. Impression:               - Preparation of the colon was fair.                           - The examined portion of the ileum was normal.                           - One 8 mm polyp in the rectum, removed with a cold                            snare. Resected and retrieved.                           - One 9 mm polyp in the descending colon, removed                            with a cold snare. Resected and retrieved. Clip (MR                            conditional) was placed. Clip manufacturer: [employer].                           -  One 8 mm polyp in the ascending colon, removed                            with a cold snare. Resected and retrieved. Clips                            (MR conditional) were placed. Clip manufacturer:                            [employer].                           - Diverticulosis in the recto-sigmoid colon, in the                            sigmoid colon, in the descending colon and in the                            transverse colon.                           - Non-bleeding internal hemorrhoids. Moderate Sedation:      Patient did not receive moderate sedation for this procedure, but       instead received monitored anesthesia care. Recommendation:           - Patient has a contact number available for                            emergencies. The signs and symptoms of potential                            delayed complications were discussed with the                             patient. Return to normal activities tomorrow.                            Written discharge instructions were provided to the                            patient.                           - High fiber diet.                           - Continue present medications.                           - Await pathology results.                           - Repeat colonoscopy for surveillance based on                            pathology results.                           -  Resume Xarelto (rivaroxaban) at prior dose                            tomorrow. Procedure Code(s):        --- Professional ---                           [id number], Colonoscopy, flexible; with removal of                            tumor(s), polyp(s), or other lesion(s) by snare                            technique Diagnosis Code(s):        --- Professional ---                           Z86.010, Personal history of colonic polyps                           K64.8, Other hemorrhoids                           D12.8, Benign neoplasm of rectum                           D12.4, Benign neoplasm of descending colon                           D12.2, Benign neoplasm of ascending colon                           Z85.038, Personal history of other malignant                            neoplasm of large intestine                           K57.30, Diverticulosis of large intestine without                            perforation or abscess without bleeding CPT copyright 2022 American Medical Association. All rights reserved. The codes documented in this report are preliminary and upon coder review may  be revised to meet current compliance requirements. Kerin Salen, MD 11/04/2023 9:21:09 AM This report has been signed electronically. Number of Addenda: 0

## 2023-11-04 NOTE — Addendum Note (Signed)
 Addendum  created 11/04/23 0957 by Dennison Nancy, CRNA   Intraprocedure Meds edited

## 2023-11-04 NOTE — Discharge Instructions (Signed)

## 2023-11-04 NOTE — Anesthesia Postprocedure Evaluation (Signed)
 Anesthesia Post Note  Patient: Connor King  Procedure(s) Performed: COLONOSCOPY WITH PROPOFOL POLYPECTOMY CONTROL OF HEMORRHAGE, GI TRACT, ENDOSCOPIC, BY CLIPPING OR OVERSEWING     Patient location during evaluation: PACU Anesthesia Type: MAC Level of consciousness: awake and alert Pain management: pain level controlled Vital Signs Assessment: post-procedure vital signs reviewed and stable Respiratory status: spontaneous breathing, nonlabored ventilation, respiratory function stable and patient connected to nasal cannula oxygen Cardiovascular status: stable and blood pressure returned to baseline Postop Assessment: no apparent nausea or vomiting Anesthetic complications: no   No notable events documented.  Last Vitals:  Vitals:   11/04/23 0930 11/04/23 0940  BP: 133/77 (!) 141/78  Pulse: 60 60  Resp: 17 16  Temp:    SpO2: 100% 99%    Last Pain:  Vitals:   11/04/23 0940  TempSrc:   PainSc: 0-No pain                 Damya Comley

## 2023-11-04 NOTE — Interval H&P Note (Signed)
 History and Physical Interval Note: 73/male for surveillance colonoscopy with propofol.  11/04/2023 7:45 AM  Connor King  has presented today for surveillance colonoscopy, with the diagnosis of History colon cancer/History aden polyps.  The various methods of treatment have been discussed with the patient and family. After consideration of risks, benefits and other options for treatment, the patient has consented to  Procedure(s): COLONOSCOPY WITH PROPOFOL (N/A) as a surgical intervention.  The patient's history has been reviewed, patient examined, no change in status, stable for surgery.  I have reviewed the patient's chart and labs.  Questions were answered to the patient's satisfaction.     Kerin Salen

## 2023-11-04 NOTE — Transfer of Care (Signed)
 Immediate Anesthesia Transfer of Care Note  Patient: Connor King  Procedure(s) Performed: COLONOSCOPY WITH PROPOFOL POLYPECTOMY CONTROL OF HEMORRHAGE, GI TRACT, ENDOSCOPIC, BY CLIPPING OR OVERSEWING  Patient Location: Endoscopy Unit  Anesthesia Type:MAC  Level of Consciousness: awake, alert , oriented, and patient cooperative  Airway & Oxygen Therapy: Patient Spontanous Breathing  Post-op Assessment: Report given to RN and Post -op Vital signs reviewed and stable  Post vital signs: Reviewed and stable  Last Vitals:  Vitals Value Taken Time  BP 115/64 11/04/23 0916  Temp 36.4 C 11/04/23 0916  Pulse 70 11/04/23 0918  Resp 17 11/04/23 0918  SpO2 92 % 11/04/23 0918  Vitals shown include unfiled device data.  Last Pain:  Vitals:   11/04/23 0916  TempSrc: Temporal  PainSc: 0-No pain         Complications: No notable events documented.

## 2023-11-05 LAB — SURGICAL PATHOLOGY

## 2023-11-06 ENCOUNTER — Encounter (HOSPITAL_COMMUNITY): Payer: Self-pay | Admitting: Gastroenterology

## 2023-11-22 DIAGNOSIS — G4733 Obstructive sleep apnea (adult) (pediatric): Secondary | ICD-10-CM | POA: Diagnosis not present

## 2023-11-25 ENCOUNTER — Ambulatory Visit (INDEPENDENT_AMBULATORY_CARE_PROVIDER_SITE_OTHER): Payer: Medicare PPO

## 2023-11-25 DIAGNOSIS — I4430 Unspecified atrioventricular block: Secondary | ICD-10-CM | POA: Diagnosis not present

## 2023-11-25 LAB — CUP PACEART REMOTE DEVICE CHECK
Battery Remaining Longevity: 97 mo
Battery Remaining Percentage: 94 %
Battery Voltage: 2.99 V
Brady Statistic AP VP Percent: 19 %
Brady Statistic AP VS Percent: 1 %
Brady Statistic AS VP Percent: 79 %
Brady Statistic AS VS Percent: 1 %
Brady Statistic RA Percent Paced: 18 %
Brady Statistic RV Percent Paced: 98 %
Date Time Interrogation Session: 20250325020014
Implantable Lead Connection Status: 753985
Implantable Lead Connection Status: 753985
Implantable Lead Implant Date: 20240624
Implantable Lead Implant Date: 20240624
Implantable Lead Location: 753859
Implantable Lead Location: 753860
Implantable Lead Model: 3830
Implantable Pulse Generator Implant Date: 20240624
Lead Channel Impedance Value: 490 Ohm
Lead Channel Impedance Value: 530 Ohm
Lead Channel Pacing Threshold Amplitude: 0.75 V
Lead Channel Pacing Threshold Amplitude: 1 V
Lead Channel Pacing Threshold Pulse Width: 0.5 ms
Lead Channel Pacing Threshold Pulse Width: 0.5 ms
Lead Channel Sensing Intrinsic Amplitude: 2.4 mV
Lead Channel Sensing Intrinsic Amplitude: 3.3 mV
Lead Channel Setting Pacing Amplitude: 2 V
Lead Channel Setting Pacing Amplitude: 2.5 V
Lead Channel Setting Pacing Pulse Width: 0.5 ms
Lead Channel Setting Sensing Sensitivity: 0.5 mV
Pulse Gen Model: 2272
Pulse Gen Serial Number: 8189756

## 2023-12-20 ENCOUNTER — Encounter: Payer: Self-pay | Admitting: Internal Medicine

## 2023-12-23 DIAGNOSIS — G4733 Obstructive sleep apnea (adult) (pediatric): Secondary | ICD-10-CM | POA: Diagnosis not present

## 2024-01-09 NOTE — Progress Notes (Signed)
 Remote pacemaker transmission.

## 2024-01-20 DIAGNOSIS — Z86711 Personal history of pulmonary embolism: Secondary | ICD-10-CM | POA: Diagnosis not present

## 2024-01-20 DIAGNOSIS — G4733 Obstructive sleep apnea (adult) (pediatric): Secondary | ICD-10-CM | POA: Diagnosis not present

## 2024-01-20 DIAGNOSIS — E1142 Type 2 diabetes mellitus with diabetic polyneuropathy: Secondary | ICD-10-CM | POA: Diagnosis not present

## 2024-01-20 DIAGNOSIS — G250 Essential tremor: Secondary | ICD-10-CM | POA: Diagnosis not present

## 2024-01-20 DIAGNOSIS — Z85038 Personal history of other malignant neoplasm of large intestine: Secondary | ICD-10-CM | POA: Diagnosis not present

## 2024-01-20 DIAGNOSIS — E1169 Type 2 diabetes mellitus with other specified complication: Secondary | ICD-10-CM | POA: Diagnosis not present

## 2024-01-22 DIAGNOSIS — G4733 Obstructive sleep apnea (adult) (pediatric): Secondary | ICD-10-CM | POA: Diagnosis not present

## 2024-01-27 ENCOUNTER — Encounter: Admitting: Cardiology

## 2024-02-11 DIAGNOSIS — L821 Other seborrheic keratosis: Secondary | ICD-10-CM | POA: Diagnosis not present

## 2024-02-11 DIAGNOSIS — L570 Actinic keratosis: Secondary | ICD-10-CM | POA: Diagnosis not present

## 2024-02-11 DIAGNOSIS — D225 Melanocytic nevi of trunk: Secondary | ICD-10-CM | POA: Diagnosis not present

## 2024-02-11 DIAGNOSIS — D485 Neoplasm of uncertain behavior of skin: Secondary | ICD-10-CM | POA: Diagnosis not present

## 2024-02-11 DIAGNOSIS — D2261 Melanocytic nevi of right upper limb, including shoulder: Secondary | ICD-10-CM | POA: Diagnosis not present

## 2024-02-11 DIAGNOSIS — Z85828 Personal history of other malignant neoplasm of skin: Secondary | ICD-10-CM | POA: Diagnosis not present

## 2024-02-11 DIAGNOSIS — C44311 Basal cell carcinoma of skin of nose: Secondary | ICD-10-CM | POA: Diagnosis not present

## 2024-02-13 ENCOUNTER — Encounter: Payer: Medicare PPO | Admitting: Internal Medicine

## 2024-02-22 DIAGNOSIS — G4733 Obstructive sleep apnea (adult) (pediatric): Secondary | ICD-10-CM | POA: Diagnosis not present

## 2024-02-24 ENCOUNTER — Ambulatory Visit (INDEPENDENT_AMBULATORY_CARE_PROVIDER_SITE_OTHER): Payer: Medicare PPO

## 2024-02-24 DIAGNOSIS — I442 Atrioventricular block, complete: Secondary | ICD-10-CM | POA: Diagnosis not present

## 2024-02-24 LAB — CUP PACEART REMOTE DEVICE CHECK
Battery Remaining Longevity: 94 mo
Battery Remaining Percentage: 91 %
Battery Voltage: 2.99 V
Brady Statistic AP VP Percent: 18 %
Brady Statistic AP VS Percent: 1 %
Brady Statistic AS VP Percent: 80 %
Brady Statistic AS VS Percent: 1 %
Brady Statistic RA Percent Paced: 17 %
Brady Statistic RV Percent Paced: 99 %
Date Time Interrogation Session: 20250624020012
Implantable Lead Connection Status: 753985
Implantable Lead Connection Status: 753985
Implantable Lead Implant Date: 20240624
Implantable Lead Implant Date: 20240624
Implantable Lead Location: 753859
Implantable Lead Location: 753860
Implantable Lead Model: 3830
Implantable Pulse Generator Implant Date: 20240624
Lead Channel Impedance Value: 510 Ohm
Lead Channel Impedance Value: 540 Ohm
Lead Channel Pacing Threshold Amplitude: 0.75 V
Lead Channel Pacing Threshold Amplitude: 1 V
Lead Channel Pacing Threshold Pulse Width: 0.5 ms
Lead Channel Pacing Threshold Pulse Width: 0.5 ms
Lead Channel Sensing Intrinsic Amplitude: 2.4 mV
Lead Channel Sensing Intrinsic Amplitude: 4.1 mV
Lead Channel Setting Pacing Amplitude: 2 V
Lead Channel Setting Pacing Amplitude: 2.5 V
Lead Channel Setting Pacing Pulse Width: 0.5 ms
Lead Channel Setting Sensing Sensitivity: 0.5 mV
Pulse Gen Model: 2272
Pulse Gen Serial Number: 8189756

## 2024-03-09 DIAGNOSIS — E1142 Type 2 diabetes mellitus with diabetic polyneuropathy: Secondary | ICD-10-CM | POA: Diagnosis not present

## 2024-03-09 DIAGNOSIS — R42 Dizziness and giddiness: Secondary | ICD-10-CM | POA: Diagnosis not present

## 2024-03-23 DIAGNOSIS — G4733 Obstructive sleep apnea (adult) (pediatric): Secondary | ICD-10-CM | POA: Diagnosis not present

## 2024-03-25 DIAGNOSIS — C44311 Basal cell carcinoma of skin of nose: Secondary | ICD-10-CM | POA: Diagnosis not present

## 2024-03-25 DIAGNOSIS — Z85828 Personal history of other malignant neoplasm of skin: Secondary | ICD-10-CM | POA: Diagnosis not present

## 2024-04-13 DIAGNOSIS — G4733 Obstructive sleep apnea (adult) (pediatric): Secondary | ICD-10-CM | POA: Diagnosis not present

## 2024-04-19 DIAGNOSIS — E1169 Type 2 diabetes mellitus with other specified complication: Secondary | ICD-10-CM | POA: Diagnosis not present

## 2024-04-19 DIAGNOSIS — E1142 Type 2 diabetes mellitus with diabetic polyneuropathy: Secondary | ICD-10-CM | POA: Diagnosis not present

## 2024-04-19 DIAGNOSIS — I442 Atrioventricular block, complete: Secondary | ICD-10-CM | POA: Diagnosis not present

## 2024-04-19 DIAGNOSIS — R42 Dizziness and giddiness: Secondary | ICD-10-CM | POA: Diagnosis not present

## 2024-04-23 DIAGNOSIS — G4733 Obstructive sleep apnea (adult) (pediatric): Secondary | ICD-10-CM | POA: Diagnosis not present

## 2024-04-28 ENCOUNTER — Encounter: Payer: Self-pay | Admitting: Cardiology

## 2024-05-17 ENCOUNTER — Ambulatory Visit: Attending: Cardiology | Admitting: Cardiology

## 2024-05-17 ENCOUNTER — Encounter: Payer: Self-pay | Admitting: Cardiology

## 2024-05-17 VITALS — BP 110/66 | HR 66 | Ht 74.0 in | Wt 246.0 lb

## 2024-05-17 DIAGNOSIS — Z950 Presence of cardiac pacemaker: Secondary | ICD-10-CM

## 2024-05-17 DIAGNOSIS — I951 Orthostatic hypotension: Secondary | ICD-10-CM | POA: Diagnosis not present

## 2024-05-17 DIAGNOSIS — R42 Dizziness and giddiness: Secondary | ICD-10-CM

## 2024-05-17 DIAGNOSIS — I442 Atrioventricular block, complete: Secondary | ICD-10-CM

## 2024-05-17 LAB — CUP PACEART INCLINIC DEVICE CHECK
Battery Remaining Longevity: 92 mo
Battery Voltage: 2.99 V
Brady Statistic RA Percent Paced: 17 %
Brady Statistic RV Percent Paced: 99 %
Date Time Interrogation Session: 20250915153421
Implantable Lead Connection Status: 753985
Implantable Lead Connection Status: 753985
Implantable Lead Implant Date: 20240624
Implantable Lead Implant Date: 20240624
Implantable Lead Location: 753859
Implantable Lead Location: 753860
Implantable Lead Model: 3830
Implantable Pulse Generator Implant Date: 20240624
Lead Channel Impedance Value: 487.5 Ohm
Lead Channel Impedance Value: 550 Ohm
Lead Channel Pacing Threshold Amplitude: 0.75 V
Lead Channel Pacing Threshold Amplitude: 0.75 V
Lead Channel Pacing Threshold Amplitude: 1.25 V
Lead Channel Pacing Threshold Amplitude: 1.25 V
Lead Channel Pacing Threshold Pulse Width: 0.5 ms
Lead Channel Pacing Threshold Pulse Width: 0.5 ms
Lead Channel Pacing Threshold Pulse Width: 0.5 ms
Lead Channel Pacing Threshold Pulse Width: 0.5 ms
Lead Channel Sensing Intrinsic Amplitude: 4.1 mV
Lead Channel Sensing Intrinsic Amplitude: 4.6 mV
Lead Channel Setting Pacing Amplitude: 2 V
Lead Channel Setting Pacing Amplitude: 2.5 V
Lead Channel Setting Pacing Pulse Width: 0.5 ms
Lead Channel Setting Sensing Sensitivity: 0.5 mV
Pulse Gen Model: 2272
Pulse Gen Serial Number: 8189756

## 2024-05-17 NOTE — Patient Instructions (Signed)

## 2024-05-17 NOTE — Progress Notes (Signed)
 Electrophysiology Office Note:   Date:  05/17/2024  ID:  Connor King, DOB 09/10/1950, MRN [id number]  Primary Cardiologist: Alvan Ronal BRAVO, MD (Inactive) Electrophysiologist: Fonda Kitty, MD      History of Present Illness:   Connor King is a 73 y.o. 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  Discussed the use of AI scribe software for clinical note transcription with the patient, who gave verbal consent to proceed.  History of Present Illness Connor King Roddy is a 74 year old male with a pacemaker who presents for routine pacemaker follow-up.  He experiences episodes of lightheadedness primarily during physical activities such as yard work, especially when bending over and standing up. The sensation is described as lightheadedness rather than dizziness. These episodes are not typically triggered by simply standing up from a seated position unless he has been physically active.  He does not experience lightheadedness when sitting at rest.  He has a history of orthostatic hypotension. He is not on any medications that lower his blood pressure. His blood pressure readings at home have occasionally been as low as 96/70, but most readings are around 100 or higher, which he considers normal for him.  His pacemaker is used almost 100% of the time in the bottom chamber of his heart, and the device was last reported to have a battery life of nearly eight years remaining. There have been instances where the device's top wire misinterprets signals, leading to mode switches that could contribute to his lightheadedness. These episodes are being monitored to assess their frequency and impact on his symptoms.  He reports occasional  leg swelling and wears compression stockings on occasion but not consistently.   Review of systems complete and found to be negative unless listed in HPI.   EP Information / Studies Reviewed:    EKG is not ordered today. EKG from 10/14/23 reviewed which showed AV paced rhythm.     Coronary CTA 10/18/22:  IMPRESSION: 1. Coronary calcium score of 142. This was 45th percentile for age-, race-, and sex-matched controls.   2. Normal coronary origin with right dominance.   3.  There mild (25-49%) plaque in the RCA.  CAD-RADS 2.   4. The RCA is not well-visualized due to motion. Will send study for FFRct.  Echo 08/2022:   1. Left ventricular ejection fraction, by estimation, is 60 to 65%. The  left ventricle has normal function. The left ventricle has no regional  wall motion abnormalities. Left ventricular diastolic function could not  be evaluated.   2. Right ventricular systolic function is normal. The right ventricular  size is mildly enlarged. There is normal pulmonary artery systolic  pressure. The estimated right ventricular systolic pressure is 29.0 mmHg.   3. Left atrial size was severely dilated.   4. Right atrial size was severely dilated.   5. The mitral valve is normal in structure. Trivial mitral valve  regurgitation. No evidence of mitral stenosis.   6. The aortic valve is normal in structure. Aortic valve regurgitation is  trivial. No aortic stenosis is present.   7. Aortic dilatation noted. There is mild dilatation of the aortic root,  measuring 42 mm. There is mild dilatation of the ascending aorta,  measuring 41 mm.   8. The inferior vena cava  is normal in size with greater than 50%  respiratory variability, suggesting right atrial pressure of 3 mmHg.    Physical Exam:   VS:  BP 110/66   Pulse 66   Ht 6' 2 (1.88 m)   Wt 246 lb (111.6 kg)   SpO2 98%   BMI 31.58 kg/m    Wt Readings from Last 3 Encounters:  05/17/24 246 lb (111.6 kg)  11/04/23 249 lb  (112.9 kg)  10/14/23 260 lb 3.2 oz (118 kg)     GEN: Well nourished, well developed in no acute distress NECK: No JVD CARDIAC: Normal rate, regular rhythm.  Well-healed left chest pacemaker pocket. RESPIRATORY:  Clear to auscultation without rales, wheezing or rhonchi  ABDOMEN: Soft, non-distended EXTREMITIES: Trace edema; No deformity   ASSESSMENT AND PLAN:    #. CHB s/p pacemaker:  #. Inappropriate mode switch: Device was inappropriately mode switching due to far field oversensing on the atrial lead.  It is possible that this could also be contributing to some of his lightheaded episodes. - In-clinic device interrogation was performed today.  Appropriate device function and stable lead parameters. - Continue remote monitoring.  #. H/o PE:  -Continue Xarelto . Follow up with PCP.   #. Orthostatic hypotension: Lightheadedness primarily occurs during physical activity, particularly when transitioning from bending over to standing.  - Advise regularly wearing compression stockings, even up to the waist if tolerated. - Encourage slow and controlled movements when transitioning from sitting or bending to standing. - Ensure adequate hydration and salt intake. - Monitor blood pressure and symptoms, consider referral to a specialist if blood pressure fluctuations become problematic.  He is not on any BP medications currently.   Follow up with Dr. Kennyth in 6 months  Signed, Fonda Kennyth, MD

## 2024-05-18 ENCOUNTER — Encounter: Admitting: Cardiology

## 2024-05-23 ENCOUNTER — Ambulatory Visit: Payer: Self-pay | Admitting: Cardiology

## 2024-05-25 ENCOUNTER — Ambulatory Visit (INDEPENDENT_AMBULATORY_CARE_PROVIDER_SITE_OTHER): Payer: Medicare PPO

## 2024-05-25 DIAGNOSIS — I442 Atrioventricular block, complete: Secondary | ICD-10-CM | POA: Diagnosis not present

## 2024-05-25 LAB — CUP PACEART REMOTE DEVICE CHECK
Battery Remaining Longevity: 90 mo
Battery Remaining Percentage: 88 %
Battery Voltage: 2.99 V
Brady Statistic AP VP Percent: 12 %
Brady Statistic AP VS Percent: 1 %
Brady Statistic AS VP Percent: 88 %
Brady Statistic AS VS Percent: 1 %
Brady Statistic RA Percent Paced: 12 %
Brady Statistic RV Percent Paced: 99 %
Date Time Interrogation Session: 20250923024700
Implantable Lead Connection Status: 753985
Implantable Lead Connection Status: 753985
Implantable Lead Implant Date: 20240624
Implantable Lead Implant Date: 20240624
Implantable Lead Location: 753859
Implantable Lead Location: 753860
Implantable Lead Model: 3830
Implantable Pulse Generator Implant Date: 20240624
Lead Channel Impedance Value: 430 Ohm
Lead Channel Impedance Value: 490 Ohm
Lead Channel Pacing Threshold Amplitude: 0.75 V
Lead Channel Pacing Threshold Amplitude: 1.25 V
Lead Channel Pacing Threshold Pulse Width: 0.5 ms
Lead Channel Pacing Threshold Pulse Width: 0.5 ms
Lead Channel Sensing Intrinsic Amplitude: 2.1 mV
Lead Channel Sensing Intrinsic Amplitude: 4.1 mV
Lead Channel Setting Pacing Amplitude: 2 V
Lead Channel Setting Pacing Amplitude: 2.5 V
Lead Channel Setting Pacing Pulse Width: 0.5 ms
Lead Channel Setting Sensing Sensitivity: 0.5 mV
Pulse Gen Model: 2272
Pulse Gen Serial Number: 8189756

## 2024-05-26 DIAGNOSIS — Z0000 Encounter for general adult medical examination without abnormal findings: Secondary | ICD-10-CM | POA: Diagnosis not present

## 2024-05-26 DIAGNOSIS — Z86718 Personal history of other venous thrombosis and embolism: Secondary | ICD-10-CM | POA: Diagnosis not present

## 2024-05-26 DIAGNOSIS — E781 Pure hyperglyceridemia: Secondary | ICD-10-CM | POA: Diagnosis not present

## 2024-05-26 DIAGNOSIS — Z85038 Personal history of other malignant neoplasm of large intestine: Secondary | ICD-10-CM | POA: Diagnosis not present

## 2024-05-26 DIAGNOSIS — R202 Paresthesia of skin: Secondary | ICD-10-CM | POA: Diagnosis not present

## 2024-05-26 DIAGNOSIS — Z1331 Encounter for screening for depression: Secondary | ICD-10-CM | POA: Diagnosis not present

## 2024-05-26 DIAGNOSIS — Z23 Encounter for immunization: Secondary | ICD-10-CM | POA: Diagnosis not present

## 2024-05-26 DIAGNOSIS — E1169 Type 2 diabetes mellitus with other specified complication: Secondary | ICD-10-CM | POA: Diagnosis not present

## 2024-05-26 DIAGNOSIS — G4733 Obstructive sleep apnea (adult) (pediatric): Secondary | ICD-10-CM | POA: Diagnosis not present

## 2024-05-26 DIAGNOSIS — G250 Essential tremor: Secondary | ICD-10-CM | POA: Diagnosis not present

## 2024-05-26 DIAGNOSIS — Z86711 Personal history of pulmonary embolism: Secondary | ICD-10-CM | POA: Diagnosis not present

## 2024-05-26 NOTE — Progress Notes (Signed)
 Remote PPM Transmission

## 2024-05-29 ENCOUNTER — Ambulatory Visit: Payer: Self-pay | Admitting: Cardiology

## 2024-07-10 ENCOUNTER — Ambulatory Visit: Payer: Self-pay | Admitting: Cardiology

## 2024-08-05 DIAGNOSIS — E119 Type 2 diabetes mellitus without complications: Secondary | ICD-10-CM | POA: Diagnosis not present

## 2024-08-05 DIAGNOSIS — H31003 Unspecified chorioretinal scars, bilateral: Secondary | ICD-10-CM | POA: Diagnosis not present

## 2024-08-24 ENCOUNTER — Ambulatory Visit: Payer: Medicare PPO

## 2024-08-24 DIAGNOSIS — I4430 Unspecified atrioventricular block: Secondary | ICD-10-CM

## 2024-08-24 LAB — CUP PACEART REMOTE DEVICE CHECK
Battery Remaining Longevity: 88 mo
Battery Remaining Percentage: 85 %
Battery Voltage: 2.99 V
Brady Statistic AP VP Percent: 19 %
Brady Statistic AP VS Percent: 1 %
Brady Statistic AS VP Percent: 80 %
Brady Statistic AS VS Percent: 1 %
Brady Statistic RA Percent Paced: 19 %
Brady Statistic RV Percent Paced: 99 %
Date Time Interrogation Session: 20251223020018
Implantable Lead Connection Status: 753985
Implantable Lead Connection Status: 753985
Implantable Lead Implant Date: 20240624
Implantable Lead Implant Date: 20240624
Implantable Lead Location: 753859
Implantable Lead Location: 753860
Implantable Lead Model: 3830
Implantable Pulse Generator Implant Date: 20240624
Lead Channel Impedance Value: 430 Ohm
Lead Channel Impedance Value: 510 Ohm
Lead Channel Pacing Threshold Amplitude: 0.75 V
Lead Channel Pacing Threshold Amplitude: 1.25 V
Lead Channel Pacing Threshold Pulse Width: 0.5 ms
Lead Channel Pacing Threshold Pulse Width: 0.5 ms
Lead Channel Sensing Intrinsic Amplitude: 1.6 mV
Lead Channel Sensing Intrinsic Amplitude: 2.5 mV
Lead Channel Setting Pacing Amplitude: 2 V
Lead Channel Setting Pacing Amplitude: 2.5 V
Lead Channel Setting Pacing Pulse Width: 0.5 ms
Lead Channel Setting Sensing Sensitivity: 0.5 mV
Pulse Gen Model: 2272
Pulse Gen Serial Number: 8189756

## 2024-08-25 NOTE — Progress Notes (Signed)
 Remote PPM Transmission

## 2024-08-28 ENCOUNTER — Ambulatory Visit: Payer: Self-pay | Admitting: Cardiology
